# Patient Record
Sex: Female | Born: 1975
Health system: Southern US, Community
[De-identification: ages and names within clinical notes are randomized; demographics above are authoritative.]

## PROBLEM LIST (undated history)

## (undated) DIAGNOSIS — O26619 Liver and biliary tract disorders in pregnancy, unspecified trimester: Secondary | ICD-10-CM

## (undated) DIAGNOSIS — K831 Obstruction of bile duct: Secondary | ICD-10-CM

## (undated) DIAGNOSIS — E119 Type 2 diabetes mellitus without complications: Secondary | ICD-10-CM

## (undated) DIAGNOSIS — O26649 Intrahepatic cholestasis of pregnancy, unspecified trimester: Secondary | ICD-10-CM

## (undated) DIAGNOSIS — O139 Gestational [pregnancy-induced] hypertension without significant proteinuria, unspecified trimester: Secondary | ICD-10-CM

## (undated) HISTORY — DX: Type 2 diabetes mellitus without complications: E11.9

## (undated) HISTORY — PX: NO PAST SURGERIES: SHX2092

---

## 2012-04-26 DIAGNOSIS — O139 Gestational [pregnancy-induced] hypertension without significant proteinuria, unspecified trimester: Secondary | ICD-10-CM

## 2015-01-13 NOTE — L&D Delivery Note (Signed)
Delivery Note At 12:22 AM a viable female was delivered via Vaginal, Spontaneous Delivery. By the time I arrived, baby had delivered.  APGAR: 6, 8; weight pending.  Placenta status: intact, spontaneous.  Cord: 3 vessel  with the following complications: none.  Anesthesia:  epidural Episiotomy:  none Lacerations:  none Suture Repair: none Est. Blood Loss (mL):  63mL  Mom to postpartum.  Baby to Couplet care / Skin to Skin.  [name] 08/08/2015, 12:37 AM  Patient is a [id number] at [redacted]w[redacted]d who was admitted for IOL due to pre-x, cholestasis, and A1DM for which her preg has been significant for. She progressed with augmentation via Pitocin then AROM.  I was gloved and present for delivery in its entirety.  Second stage of labor progressed, baby delivered with RN present after one contraction.  No decels during second stage noted.  Complications: none  Lacerations: none  EBL: 50cc  Will continue Mag sulfate x 24 hrs PP.  Cam Hai, CNM 7:52 AM  08/08/2015

## 2015-03-21 LAB — OB RESULTS CONSOLE ABO/RH: RH TYPE: POSITIVE

## 2015-03-21 LAB — OB RESULTS CONSOLE HGB/HCT, BLOOD
HEMATOCRIT: 34 %
Hemoglobin: 10.7 g/dL

## 2015-03-21 LAB — OB RESULTS CONSOLE RPR: RPR: NONREACTIVE

## 2015-03-21 LAB — SICKLE CELL SCREEN

## 2015-03-21 LAB — OB RESULTS CONSOLE ANTIBODY SCREEN: Antibody Screen: NEGATIVE

## 2015-03-21 LAB — OB RESULTS CONSOLE HEPATITIS B SURFACE ANTIGEN: Hepatitis B Surface Ag: NEGATIVE

## 2015-04-02 LAB — GLUCOSE TOLERANCE, 1 HOUR: Glucose 1 Hour: 156

## 2015-04-02 LAB — OB RESULTS CONSOLE GC/CHLAMYDIA
CHLAMYDIA, DNA PROBE: NEGATIVE
GC PROBE AMP, GENITAL: NEGATIVE

## 2015-05-06 LAB — LAB REPORT - SCANNED

## 2015-07-22 ENCOUNTER — Encounter (HOSPITAL_COMMUNITY): Payer: Self-pay | Admitting: *Deleted

## 2015-07-22 ENCOUNTER — Encounter: Payer: Self-pay | Admitting: Obstetrics and Gynecology

## 2015-07-22 ENCOUNTER — Ambulatory Visit (INDEPENDENT_AMBULATORY_CARE_PROVIDER_SITE_OTHER): Payer: Self-pay | Admitting: Obstetrics and Gynecology

## 2015-07-22 ENCOUNTER — Inpatient Hospital Stay (HOSPITAL_COMMUNITY)
Admission: AD | Admit: 2015-07-22 | Discharge: 2015-07-22 | Disposition: A | Discharge status: Home / Self Care | Payer: Self-pay | Source: Ambulatory Visit | Attending: Obstetrics & Gynecology | Admitting: Obstetrics & Gynecology

## 2015-07-22 VITALS — BP 160/83 | HR 51 | Wt 239.0 lb

## 2015-07-22 DIAGNOSIS — O133 Gestational [pregnancy-induced] hypertension without significant proteinuria, third trimester: Secondary | ICD-10-CM

## 2015-07-22 DIAGNOSIS — Z833 Family history of diabetes mellitus: Secondary | ICD-10-CM | POA: Insufficient documentation

## 2015-07-22 DIAGNOSIS — Z3A34 34 weeks gestation of pregnancy: Secondary | ICD-10-CM | POA: Insufficient documentation

## 2015-07-22 DIAGNOSIS — Z880 Allergy status to penicillin: Secondary | ICD-10-CM | POA: Insufficient documentation

## 2015-07-22 DIAGNOSIS — R7302 Impaired glucose tolerance (oral): Secondary | ICD-10-CM

## 2015-07-22 DIAGNOSIS — O169 Unspecified maternal hypertension, unspecified trimester: Secondary | ICD-10-CM | POA: Insufficient documentation

## 2015-07-22 DIAGNOSIS — O0990 Supervision of high risk pregnancy, unspecified, unspecified trimester: Secondary | ICD-10-CM | POA: Insufficient documentation

## 2015-07-22 DIAGNOSIS — O1400 Mild to moderate pre-eclampsia, unspecified trimester: Secondary | ICD-10-CM

## 2015-07-22 DIAGNOSIS — R7309 Other abnormal glucose: Secondary | ICD-10-CM | POA: Insufficient documentation

## 2015-07-22 DIAGNOSIS — Z23 Encounter for immunization: Secondary | ICD-10-CM

## 2015-07-22 HISTORY — DX: Gestational (pregnancy-induced) hypertension without significant proteinuria, unspecified trimester: O13.9

## 2015-07-22 LAB — COMPREHENSIVE METABOLIC PANEL
ALK PHOS: 131 U/L — AB (ref 38–126)
ALT: 11 U/L — AB (ref 14–54)
AST: 15 U/L (ref 15–41)
Albumin: 2.7 g/dL — ABNORMAL LOW (ref 3.5–5.0)
Anion gap: 10 (ref 5–15)
BILIRUBIN TOTAL: 0.4 mg/dL (ref 0.3–1.2)
BUN: 6 mg/dL (ref 6–20)
CALCIUM: 9.5 mg/dL (ref 8.9–10.3)
CHLORIDE: 107 mmol/L (ref 101–111)
CO2: 18 mmol/L — ABNORMAL LOW (ref 22–32)
CREATININE: 0.41 mg/dL — AB (ref 0.44–1.00)
Glucose, Bld: 93 mg/dL (ref 65–99)
Potassium: 3.7 mmol/L (ref 3.5–5.1)
Sodium: 135 mmol/L (ref 135–145)
TOTAL PROTEIN: 6.4 g/dL — AB (ref 6.5–8.1)

## 2015-07-22 LAB — URINE MICROSCOPIC-ADD ON

## 2015-07-22 LAB — CBC
HEMATOCRIT: 30.6 % — AB (ref 36.0–46.0)
Hemoglobin: 10 g/dL — ABNORMAL LOW (ref 12.0–15.0)
MCH: 24.6 pg — AB (ref 26.0–34.0)
MCHC: 32.7 g/dL (ref 30.0–36.0)
MCV: 75.2 fL — AB (ref 78.0–100.0)
PLATELETS: 207 10*3/uL (ref 150–400)
RBC: 4.07 MIL/uL (ref 3.87–5.11)
RDW: 17.1 % — ABNORMAL HIGH (ref 11.5–15.5)
WBC: 8.7 10*3/uL (ref 4.0–10.5)

## 2015-07-22 LAB — POCT URINALYSIS DIP (DEVICE)
BILIRUBIN URINE: NEGATIVE
GLUCOSE, UA: NEGATIVE mg/dL
Hgb urine dipstick: NEGATIVE
KETONES UR: NEGATIVE mg/dL
Leukocytes, UA: NEGATIVE
Nitrite: NEGATIVE
PH: 7 (ref 5.0–8.0)
PROTEIN: NEGATIVE mg/dL
SPECIFIC GRAVITY, URINE: 1.01 (ref 1.005–1.030)
Urobilinogen, UA: 0.2 mg/dL (ref 0.0–1.0)

## 2015-07-22 LAB — PROTEIN / CREATININE RATIO, URINE
CREATININE, URINE: 23 mg/dL
Protein Creatinine Ratio: 0.74 mg/mg{Cre} — ABNORMAL HIGH (ref 0.00–0.15)
TOTAL PROTEIN, URINE: 17 mg/dL

## 2015-07-22 LAB — URINALYSIS, ROUTINE W REFLEX MICROSCOPIC
BILIRUBIN URINE: NEGATIVE
Glucose, UA: NEGATIVE mg/dL
KETONES UR: NEGATIVE mg/dL
NITRITE: NEGATIVE
PROTEIN: NEGATIVE mg/dL
SPECIFIC GRAVITY, URINE: 1.01 (ref 1.005–1.030)
pH: 7 (ref 5.0–8.0)

## 2015-07-22 NOTE — Progress Notes (Signed)
Subjective:  Rebecca Ruiz is a 40 y.o. [id number] at 6713w6d being seen today for initial prenatal care.  She is currently monitored for the following issues for this high-risk pregnancy and has Supervision of high-risk pregnancy; Elevated blood pressure affecting pregnancy, antepartum; Elevated glucose tolerance test; and Gestational hypertension w/o significant proteinuria in 3rd trimester on her problem list.  Patient reports leg swelling.  Contractions: Irritability. Vag. Bleeding: None.  Movement: Present. Denies leaking of fluid.   The following portions of the patient's history were reviewed and updated as appropriate: allergies, current medications, past family history, past medical history, past social history, past surgical history and problem list. Problem list updated.  Objective:   Filed Vitals:   07/22/15 0851 07/22/15 0854  BP: 148/81 160/83  Pulse: 51   Weight: 239 lb (108.41 kg)     Fetal Status: Fetal Heart Rate (bpm): 159 Fundal Height: 34 cm Movement: Present     General:  Alert, oriented and cooperative. Patient is in no acute distress.  Skin: Skin is warm and dry. No rash noted.   Cardiovascular: Normal heart rate noted  Respiratory: Normal respiratory effort, no problems with respiration noted  Abdomen: Soft, gravid, appropriate for gestational age. Pain/Pressure: Present     Pelvic:  Cervical exam deferred        Extremities: Normal range of motion.  Edema: Moderate pitting, indentation subsides rapidly  Mental Status: Normal mood and affect. Normal behavior. Normal judgment and thought content.   Urinalysis:      Assessment and Plan:  Pregnancy: [id number] at 5913w6d  # Elevated bp - at risk given hx ghtn. Asymptomatic, but second bp severe range - to mau for preE/hellp/ghtn eval - assuming discharge and dx made, will need u/s for growth and starting antenatal testing. mau provider made aware  # pregnancy - tdap today. Antenatal records reviewed, f/u  records requested  There are no diagnoses linked to this encounter. Preterm labor symptoms and general obstetric precautions including but not limited to vaginal bleeding, contractions, leaking of fluid and fetal movement were reviewed in detail with the patient. Please refer to After Visit Summary for other counseling recommendations.   Kathrynn RunningNoah Bedford Rebecca Swopes, MD

## 2015-07-22 NOTE — Discharge Instructions (Signed)
Hipertensin durante el embarazo (Hypertension During Pregnancy) La hipertensin tambin se denomina presin arterial alta. La presin arterial hace que se mueva la sangre en el cuerpo. A veces, la fuerza que Northrop Grummanmueve la sangre es demasiado intensa. Cuando est embarazada, esta afeccin se debe controlar atentamente. Puede causar problemas para usted y su beb. CUIDADOS EN EL HOGAR   Cumpla con todos los controles mdicos.  Tome los United Parcelmedicamentos como le indic su mdico. Informe a su mdico sobre todos los medicamentos que toma.  Coma muy poca sal.  Haga ejercicios regularmente.  No beba alcohol.  No fume.  No tome bebidas con cafena.  Acustese sobre el lado izquierdo cuando haga reposo.  Su mdico puede recomendarle que tome una aspirina de dosis baja (81 mg) cada da. SOLICITE AYUDA DE INMEDIATO SI:  Siente un dolor intenso en el vientre (abdominal).  Nota una hinchazn repentina Jabil Circuiten las manos, los tobillos o la cara.  Aument 4libras (1,8kg) o ms en 1semana.  Vomita) repetidas veces.  Tiene una hemorragia por la vagina.  No siente los movimientos del beb.  Tiene cefalea.  Tiene visin doble o borrosa.  Tiene calambres o espasmos musculares.  Le falta el aire.  Tiene las yemas de los dedos y los labios Carrier Millsazules.  Observa sangre en la orina. ASEGRESE DE QUE:  Comprende estas instrucciones.  Controlar su afeccin.  Recibir ayuda de inmediato si no mejora o si empeora.   Esta informacin no tiene Theme park managercomo fin reemplazar el consejo del mdico. Asegrese de hacerle al mdico cualquier pregunta que tenga.   Document Released: 04/15/2010 Document Revised: 01/19/2014 Elsevier Interactive Patient Education [employer]2016 Elsevier Inc.

## 2015-07-22 NOTE — MAU Note (Signed)
Sen up from clinic for further eval of BP.  Hx of elevation with last preg.

## 2015-07-22 NOTE — MAU Provider Note (Signed)
MAU HISTORY AND PHYSICAL  Chief Complaint:  Elevated BP in clinic  Rebecca Ruiz is a 40 y.o.  [id number] with IUP at 4025w6d [facility]. She denies h/a, epigastric pain, or visual disturbances.  She has hx of GHTN with previous pregnancy. She reports good fetal movement, denies LOF, vaginal bleeding, vaginal itching/burning, urinary symptoms, h/a, dizziness, n/v, or fever/chills.     Past Medical History  Diagnosis Date  . Pregnancy induced hypertension     Past Surgical History  Procedure Laterality Date  . No past surgeries      Family History  Problem Relation Age of Onset  . Diabetes Mother   . Cancer Mother     colon    Social History  Substance Use Topics  . Smoking status: Never Smoker   . Smokeless tobacco: Never Used  . Alcohol Use: No    Allergies  Allergen Reactions  . Penicillins Rash    Has patient had a PCN reaction causing immediate rash, facial/tongue/throat swelling, SOB or lightheadedness with hypotension: Yes Has patient had a PCN reaction causing severe rash involving mucus membranes or skin necrosis: Yes Has patient had a PCN reaction that required hospitalization No Has patient had a PCN reaction occurring within the last 10 years: No If all of the above answers are "NO", then may proceed with Cephalosporin use.    No prescriptions prior to admission    Review of Systems - Negative except for what is mentioned in HPI.  Physical Exam  Blood pressure 142/89, pulse 57, temperature 98.2 F (36.8 C), temperature source Oral, resp. rate 16, last menstrual period 11/20/2014. GENERAL: Well-developed, well-nourished female in no acute distress.  LUNGS: Clear to auscultation bilaterally.  HEART: Regular rate and rhythm. ABDOMEN: Soft, nontender, nondistended, gravid.  EXTREMITIES: Nontender, 1+ nonpitting edema BLE, 2+ distal pulses. FHT:  Cat 1 Contractions: infrequent   Labs: Results for orders placed or  performed during the hospital encounter of 07/22/15 (from the past 24 hour(s))  Urinalysis, Routine w reflex microscopic ([facility])   Collection Time: 07/22/15  9:51 AM  Result Value Ref Range   Color, Urine YELLOW YELLOW   APPearance CLEAR CLEAR   Specific Gravity, Urine 1.010 1.005 - 1.030   pH 7.0 5.0 - 8.0   Glucose, UA NEGATIVE NEGATIVE mg/dL   Hgb urine dipstick TRACE (A) NEGATIVE   Bilirubin Urine NEGATIVE NEGATIVE   Ketones, ur NEGATIVE NEGATIVE mg/dL   Protein, ur NEGATIVE NEGATIVE mg/dL   Nitrite NEGATIVE NEGATIVE   Leukocytes, UA LARGE (A) NEGATIVE  Urine microscopic-add on   Collection Time: 07/22/15  9:51 AM  Result Value Ref Range   Squamous Epithelial / LPF 0-5 (A) NONE SEEN   WBC, UA 6-30 0 - 5 WBC/hpf   RBC / HPF 0-5 0 - 5 RBC/hpf   Bacteria, UA FEW (A) NONE SEEN  Protein / creatinine ratio, urine   Collection Time: 07/22/15 10:05 AM  Result Value Ref Range   Creatinine, Urine 23.00 mg/dL   Total Protein, Urine 17 mg/dL   Protein Creatinine Ratio 0.74 (H) 0.00 - 0.15 mg/mg[Cre]  CBC   Collection Time: 07/22/15 10:51 AM  Result Value Ref Range   WBC 8.7 4.0 - 10.5 K/uL   RBC 4.07 3.87 - 5.11 MIL/uL   Hemoglobin 10.0 (L) 12.0 - 15.0 g/dL   HCT 19.130.6 (L) 47.836.0 - 29.546.0 %   MCV 75.2 (L) 78.0 - 100.0 fL   MCH  24.6 (L) 26.0 - 34.0 pg   MCHC 32.7 30.0 - 36.0 g/dL   RDW 16.1 (H) 09.6 - 04.5 %   Platelets 207 150 - 400 K/uL  Comprehensive metabolic panel   Collection Time: 07/22/15 10:51 AM  Result Value Ref Range   Sodium 135 135 - 145 mmol/L   Potassium 3.7 3.5 - 5.1 mmol/L   Chloride 107 101 - 111 mmol/L   CO2 18 (L) 22 - 32 mmol/L   Glucose, Bld 93 65 - 99 mg/dL   BUN 6 6 - 20 mg/dL   Creatinine, Ser 4.09 (L) 0.44 - 1.00 mg/dL   Calcium 9.5 8.9 - 81.1 mg/dL   Total Protein 6.4 (L) 6.5 - 8.1 g/dL   Albumin 2.7 (L) 3.5 - 5.0 g/dL   AST 15 15 - 41 U/L   ALT 11 (L) 14 - 54 U/L   Alkaline Phosphatase 131 (H) 38 - 126 U/L   Total Bilirubin 0.4 0.3 - 1.2  mg/dL   GFR calc non Af Amer >60 >60 mL/min   GFR calc Af Amer >60 >60 mL/min   Anion gap 10 5 - 15  Results for orders placed or performed in visit on 07/22/15 (from the past 24 hour(s))  POCT urinalysis dip (device)   Collection Time: 07/22/15  9:21 AM  Result Value Ref Range   Glucose, UA NEGATIVE NEGATIVE mg/dL   Bilirubin Urine NEGATIVE NEGATIVE   Ketones, ur NEGATIVE NEGATIVE mg/dL   Specific Gravity, Urine 1.010 1.005 - 1.030   Hgb urine dipstick NEGATIVE NEGATIVE   pH 7.0 5.0 - 8.0   Protein, ur NEGATIVE NEGATIVE mg/dL   Urobilinogen, UA 0.2 0.0 - 1.0 mg/dL   Nitrite NEGATIVE NEGATIVE   Leukocytes, UA NEGATIVE NEGATIVE   MDM: Ordered labs and reviewed results.  Preeclampsia labs wnl with P/C ratio pending at time of discharge.  Pt without s/sx of preeclampsia, no severe features.  D/C home with preeclampsia precautions.  Message sent to clinic to start antenatal testing with BPP or NST/AFI and BP check later this week. Delivery at 37 weeks unless preeclampsia with severe features develops. Pt stable at time of discharge.   Assessment: Rebecca Ruiz is  40 y.o. [id number] at [redacted]w[redacted]d  1. Gestational hypertension w/o significant proteinuria in 3rd trimester     Plan: D/C home with preeclampsia precautions Start antenatal testing for [facility] [place] for growth ordered  LEFTWICH-KIRBY, Merril Isakson 7/10/20179:45 PM   Addendum:  P/C ratio 0.74 so diagnosis is preeclampsia without severe features.  Continue plan for antenatal testing/delivery at 37 weeks unless severe features develop.

## 2015-07-22 NOTE — Progress Notes (Signed)
Mariel for interpreter for check in  Patient denies headaches, dizziness, or blurry vision

## 2015-07-23 LAB — HIV ANTIBODY (ROUTINE TESTING W REFLEX): HIV SCREEN 4TH GENERATION: NONREACTIVE

## 2015-07-23 LAB — RPR: RPR Ser Ql: NONREACTIVE

## 2015-07-29 ENCOUNTER — Ambulatory Visit (INDEPENDENT_AMBULATORY_CARE_PROVIDER_SITE_OTHER): Payer: Self-pay | Admitting: Obstetrics and Gynecology

## 2015-07-29 VITALS — BP 136/76 | HR 71 | Wt 238.7 lb

## 2015-07-29 DIAGNOSIS — O24419 Gestational diabetes mellitus in pregnancy, unspecified control: Secondary | ICD-10-CM

## 2015-07-29 DIAGNOSIS — Z113 Encounter for screening for infections with a predominantly sexual mode of transmission: Secondary | ICD-10-CM

## 2015-07-29 DIAGNOSIS — O1403 Mild to moderate pre-eclampsia, third trimester: Secondary | ICD-10-CM

## 2015-07-29 DIAGNOSIS — O0993 Supervision of high risk pregnancy, unspecified, third trimester: Secondary | ICD-10-CM

## 2015-07-29 LAB — POCT URINALYSIS DIP (DEVICE)
GLUCOSE, UA: NEGATIVE mg/dL
Ketones, ur: NEGATIVE mg/dL
Nitrite: NEGATIVE
Protein, ur: 100 mg/dL — AB
Specific Gravity, Urine: 1.02 (ref 1.005–1.030)
UROBILINOGEN UA: 1 mg/dL (ref 0.0–1.0)
pH: 7 (ref 5.0–8.0)

## 2015-07-29 LAB — OB RESULTS CONSOLE GBS: STREP GROUP B AG: NEGATIVE

## 2015-07-29 LAB — CBC
HCT: 30.2 % — ABNORMAL LOW (ref 35.0–45.0)
HEMOGLOBIN: 9.8 g/dL — AB (ref 11.7–15.5)
MCH: 24.3 pg — AB (ref 27.0–33.0)
MCHC: 32.5 g/dL (ref 32.0–36.0)
MCV: 74.8 fL — ABNORMAL LOW (ref 80.0–100.0)
MPV: 9.2 fL (ref 7.5–12.5)
PLATELETS: 200 10*3/uL (ref 140–400)
RBC: 4.04 MIL/uL (ref 3.80–5.10)
RDW: 17.5 % — ABNORMAL HIGH (ref 11.0–15.0)
WBC: 8.7 10*3/uL (ref 3.8–10.8)

## 2015-07-29 NOTE — Progress Notes (Signed)
US and BPP scheduled 7/20 @ 1045

## 2015-07-29 NOTE — Progress Notes (Signed)
Video Interpreter [phone] 36 wk cultures today

## 2015-07-29 NOTE — Progress Notes (Signed)
Subjective:  Rebecca Ruiz is a 40 y.o. [phone] at 2234w6d being seen today for ongoing prenatal care.  She is currently monitored for the following issues for this high-risk pregnancy and has Supervision of high-risk pregnancy; Mild preeclampsia; and Gestational diabetes mellitus on her problem list.  Patient reports no complaints.  Contractions: Irregular. Vag. Bleeding: None.  Movement: Present. Denies leaking of fluid.   The following portions of the patient's history were reviewed and updated as appropriate: allergies, current medications, past family history, past medical history, past social history, past surgical history and problem list. Problem list updated.  Objective:   Filed Vitals:   07/29/15 1527  BP: 136/76  Pulse: 71  Weight: 238 lb 11.2 oz (108.274 kg)    Fetal Status: Fetal Heart Rate (bpm): 161   Movement: Present     General:  Alert, oriented and cooperative. Patient is in no acute distress.  Skin: Skin is warm and dry. No rash noted.   Cardiovascular: Normal heart rate noted  Respiratory: Normal respiratory effort, no problems with respiration noted  Abdomen: Soft, gravid, appropriate for gestational age. Pain/Pressure: Present     Pelvic:  Cervical exam deferred        Extremities: Normal range of motion.  Edema: Mild pitting, slight indentation  Mental Status: Normal mood and affect. Normal behavior. Normal judgment and thought content.   Urinalysis:      Assessment and Plan:  Pregnancy: [id number] at 5934w6d  # mild preE - asymptomatic - cbc, cmp today. bp wnl - start 2x weekly nst - iol @ 37 wks, scheduling today - growth u/s ordered  # GDM - results of 3 hour arrived today, 2/4 results abnormal - 2x weekly testing - growth u/s - given iol in one week, do not think much benefit of starting glucose testing now - a1c, and random sugar with cmp  # pruritus - no rash - bile acids  term labor symptoms and general obstetric precautions including  but not limited to vaginal bleeding, contractions, leaking of fluid and fetal movement were reviewed in detail with the patient. Please refer to After Visit Summary for other counseling recommendations.  F/u for nst later this week with afi  Kathrynn RunningNoah Bedford Arias Weinert, MD

## 2015-07-30 LAB — COMPREHENSIVE METABOLIC PANEL
ALBUMIN: 2.7 g/dL — AB (ref 3.6–5.1)
ALK PHOS: 154 U/L — AB (ref 33–115)
ALT: 8 U/L (ref 6–29)
AST: 14 U/L (ref 10–30)
BILIRUBIN TOTAL: 0.3 mg/dL (ref 0.2–1.2)
BUN: 7 mg/dL (ref 7–25)
CALCIUM: 8.6 mg/dL (ref 8.6–10.2)
CO2: 22 mmol/L (ref 20–31)
Chloride: 104 mmol/L (ref 98–110)
Creat: 0.59 mg/dL (ref 0.50–1.10)
Glucose, Bld: 102 mg/dL — ABNORMAL HIGH (ref 65–99)
POTASSIUM: 3.8 mmol/L (ref 3.5–5.3)
Sodium: 138 mmol/L (ref 135–146)
TOTAL PROTEIN: 5.8 g/dL — AB (ref 6.1–8.1)

## 2015-07-30 LAB — GC/CHLAMYDIA PROBE AMP (~~LOC~~) NOT AT ARMC
Chlamydia: NEGATIVE
NEISSERIA GONORRHEA: NEGATIVE

## 2015-07-30 LAB — HEMOGLOBIN A1C
HEMOGLOBIN A1C: 5.6 % (ref <5.7)
MEAN PLASMA GLUCOSE: 114 mg/dL

## 2015-07-31 ENCOUNTER — Telehealth (HOSPITAL_COMMUNITY): Payer: Self-pay | Admitting: *Deleted

## 2015-07-31 ENCOUNTER — Encounter (HOSPITAL_COMMUNITY): Payer: Self-pay | Admitting: *Deleted

## 2015-07-31 LAB — CULTURE, STREPTOCOCCUS GRP B W/SUSCEPT

## 2015-07-31 NOTE — Telephone Encounter (Signed)
Interpreter number [phone]

## 2015-08-01 ENCOUNTER — Encounter: Payer: Self-pay | Admitting: Obstetrics and Gynecology

## 2015-08-01 ENCOUNTER — Other Ambulatory Visit: Payer: Self-pay | Admitting: Obstetrics & Gynecology

## 2015-08-01 ENCOUNTER — Encounter (HOSPITAL_COMMUNITY): Payer: Self-pay

## 2015-08-01 ENCOUNTER — Ambulatory Visit (HOSPITAL_COMMUNITY)
Admission: RE | Admit: 2015-08-01 | Discharge: 2015-08-01 | Disposition: A | Discharge status: Home / Self Care | Payer: Self-pay | Source: Ambulatory Visit | Attending: Obstetrics and Gynecology | Admitting: Obstetrics and Gynecology

## 2015-08-01 ENCOUNTER — Telehealth: Payer: Self-pay | Admitting: Obstetrics and Gynecology

## 2015-08-01 ENCOUNTER — Telehealth: Payer: Self-pay

## 2015-08-01 ENCOUNTER — Other Ambulatory Visit: Payer: Self-pay | Admitting: Obstetrics and Gynecology

## 2015-08-01 DIAGNOSIS — O24419 Gestational diabetes mellitus in pregnancy, unspecified control: Secondary | ICD-10-CM

## 2015-08-01 DIAGNOSIS — Z8759 Personal history of other complications of pregnancy, childbirth and the puerperium: Secondary | ICD-10-CM

## 2015-08-01 DIAGNOSIS — O1403 Mild to moderate pre-eclampsia, third trimester: Secondary | ICD-10-CM

## 2015-08-01 DIAGNOSIS — O09213 Supervision of pregnancy with history of pre-term labor, third trimester: Secondary | ICD-10-CM | POA: Insufficient documentation

## 2015-08-01 DIAGNOSIS — O99019 Anemia complicating pregnancy, unspecified trimester: Secondary | ICD-10-CM | POA: Insufficient documentation

## 2015-08-01 DIAGNOSIS — Z3A36 36 weeks gestation of pregnancy: Secondary | ICD-10-CM | POA: Insufficient documentation

## 2015-08-01 DIAGNOSIS — O26619 Liver and biliary tract disorders in pregnancy, unspecified trimester: Secondary | ICD-10-CM

## 2015-08-01 DIAGNOSIS — O1493 Unspecified pre-eclampsia, third trimester: Secondary | ICD-10-CM | POA: Insufficient documentation

## 2015-08-01 DIAGNOSIS — O09523 Supervision of elderly multigravida, third trimester: Secondary | ICD-10-CM | POA: Insufficient documentation

## 2015-08-01 DIAGNOSIS — O26613 Liver and biliary tract disorders in pregnancy, third trimester: Principal | ICD-10-CM

## 2015-08-01 DIAGNOSIS — Z3689 Encounter for other specified antenatal screening: Secondary | ICD-10-CM

## 2015-08-01 DIAGNOSIS — Z36 Encounter for antenatal screening of mother: Secondary | ICD-10-CM | POA: Insufficient documentation

## 2015-08-01 DIAGNOSIS — O09293 Supervision of pregnancy with other poor reproductive or obstetric history, third trimester: Secondary | ICD-10-CM | POA: Insufficient documentation

## 2015-08-01 DIAGNOSIS — K831 Obstruction of bile duct: Secondary | ICD-10-CM | POA: Insufficient documentation

## 2015-08-01 HISTORY — DX: Obstruction of bile duct: K83.1

## 2015-08-01 HISTORY — DX: Intrahepatic cholestasis of pregnancy, unspecified trimester: O26.649

## 2015-08-01 HISTORY — DX: Liver and biliary tract disorders in pregnancy, unspecified trimester: O26.619

## 2015-08-01 LAB — BILE ACIDS, TOTAL: Bile Acids Total: 16 umol/L (ref 0–19)

## 2015-08-01 MED ORDER — URSODIOL 300 MG PO CAPS: 300.0000 mg | ORAL_CAPSULE | Freq: Three times a day (TID) | ORAL | Status: DC

## 2015-08-01 MED FILL — *URSODIOL 300 MG CAPSULE: 300 | 5 days supply | Qty: 16 | Fill #0

## 2015-08-01 NOTE — Telephone Encounter (Signed)
Bile acids 16 with pruritus, no rash. New dx cholestasis. Already in 2x weekly testing with iol scheduled for 37 wks. Will have nursing call to inform, and will start ursodiol.

## 2015-08-01 NOTE — Telephone Encounter (Signed)
This patient has cholestasis of pregnancy. I have sent a script for ursodiol to the [facility] as Diane says it is much cheaper there than at other pharmacies. Please help her schedule an NST for Monday or Tuesday. She has induction scheduled this coming Wednesday.    Patient has been notified of medication and she has been schedule for appointment for next week.

## 2015-08-06 ENCOUNTER — Ambulatory Visit (INDEPENDENT_AMBULATORY_CARE_PROVIDER_SITE_OTHER): Payer: Self-pay | Admitting: *Deleted

## 2015-08-06 VITALS — BP 156/82

## 2015-08-06 DIAGNOSIS — O26613 Liver and biliary tract disorders in pregnancy, third trimester: Secondary | ICD-10-CM

## 2015-08-06 DIAGNOSIS — K831 Obstruction of bile duct: Secondary | ICD-10-CM

## 2015-08-06 NOTE — Progress Notes (Addendum)
IOL scheduled tomorrow @ 0700 NST reactive on 08/06/15 with baseline 120.  No decelerations.

## 2015-08-07 ENCOUNTER — Inpatient Hospital Stay (HOSPITAL_COMMUNITY): Payer: Medicaid Other | Admitting: Anesthesiology

## 2015-08-07 ENCOUNTER — Inpatient Hospital Stay (HOSPITAL_COMMUNITY)
Admission: RE | Admit: 2015-08-07 | Discharge: 2015-08-09 | DRG: 775 | Disposition: A | Discharge status: Home / Self Care | Payer: Medicaid Other | Source: Ambulatory Visit | Attending: Obstetrics and Gynecology | Admitting: Obstetrics and Gynecology

## 2015-08-07 ENCOUNTER — Encounter (HOSPITAL_COMMUNITY): Payer: Self-pay

## 2015-08-07 DIAGNOSIS — D649 Anemia, unspecified: Secondary | ICD-10-CM | POA: Diagnosis present

## 2015-08-07 DIAGNOSIS — K831 Obstruction of bile duct: Secondary | ICD-10-CM | POA: Diagnosis present

## 2015-08-07 DIAGNOSIS — O99214 Obesity complicating childbirth: Secondary | ICD-10-CM | POA: Diagnosis present

## 2015-08-07 DIAGNOSIS — O2662 Liver and biliary tract disorders in childbirth: Secondary | ICD-10-CM | POA: Diagnosis present

## 2015-08-07 DIAGNOSIS — O9902 Anemia complicating childbirth: Secondary | ICD-10-CM | POA: Diagnosis present

## 2015-08-07 DIAGNOSIS — Z3A37 37 weeks gestation of pregnancy: Secondary | ICD-10-CM

## 2015-08-07 DIAGNOSIS — O1404 Mild to moderate pre-eclampsia, complicating childbirth: Secondary | ICD-10-CM | POA: Diagnosis present

## 2015-08-07 DIAGNOSIS — Z833 Family history of diabetes mellitus: Secondary | ICD-10-CM

## 2015-08-07 DIAGNOSIS — O24429 Gestational diabetes mellitus in childbirth, unspecified control: Secondary | ICD-10-CM | POA: Diagnosis present

## 2015-08-07 DIAGNOSIS — Z6841 Body Mass Index (BMI) 40.0 and over, adult: Secondary | ICD-10-CM | POA: Diagnosis not present

## 2015-08-07 DIAGNOSIS — O1400 Mild to moderate pre-eclampsia, unspecified trimester: Secondary | ICD-10-CM | POA: Diagnosis present

## 2015-08-07 LAB — CBC
HCT: 30 % — ABNORMAL LOW (ref 36.0–46.0)
HEMATOCRIT: 32 % — AB (ref 36.0–46.0)
HEMOGLOBIN: 10.3 g/dL — AB (ref 12.0–15.0)
Hemoglobin: 9.6 g/dL — ABNORMAL LOW (ref 12.0–15.0)
MCH: 23.8 pg — AB (ref 26.0–34.0)
MCH: 24 pg — AB (ref 26.0–34.0)
MCHC: 32 g/dL (ref 30.0–36.0)
MCHC: 32.2 g/dL (ref 30.0–36.0)
MCV: 74.4 fL — AB (ref 78.0–100.0)
MCV: 74.6 fL — ABNORMAL LOW (ref 78.0–100.0)
PLATELETS: 211 10*3/uL (ref 150–400)
Platelets: 214 10*3/uL (ref 150–400)
RBC: 4.03 MIL/uL (ref 3.87–5.11)
RBC: 4.29 MIL/uL (ref 3.87–5.11)
RDW: 17.3 % — AB (ref 11.5–15.5)
RDW: 17.5 % — ABNORMAL HIGH (ref 11.5–15.5)
WBC: 9.5 10*3/uL (ref 4.0–10.5)
WBC: 13.2 10*3/uL — ABNORMAL HIGH (ref 4.0–10.5)

## 2015-08-07 LAB — RPR: RPR: NONREACTIVE

## 2015-08-07 LAB — COMPREHENSIVE METABOLIC PANEL
ALK PHOS: 140 U/L — AB (ref 38–126)
ALT: 16 U/L (ref 14–54)
AST: 20 U/L (ref 15–41)
Albumin: 2.4 g/dL — ABNORMAL LOW (ref 3.5–5.0)
Anion gap: 7 (ref 5–15)
BUN: 7 mg/dL (ref 6–20)
CALCIUM: 8.1 mg/dL — AB (ref 8.9–10.3)
CHLORIDE: 106 mmol/L (ref 101–111)
CO2: 20 mmol/L — AB (ref 22–32)
CREATININE: 0.44 mg/dL (ref 0.44–1.00)
GFR calc Af Amer: >60 mL/min (ref >60)
Glucose, Bld: 88 mg/dL (ref 65–99)
Potassium: 3.4 mmol/L — ABNORMAL LOW (ref 3.5–5.1)
Sodium: 133 mmol/L — ABNORMAL LOW (ref 135–145)
Total Bilirubin: 0.4 mg/dL (ref 0.3–1.2)
Total Protein: 5.6 g/dL — ABNORMAL LOW (ref 6.5–8.1)

## 2015-08-07 LAB — TYPE AND SCREEN
ABO/RH(D): AB POS
Antibody Screen: NEGATIVE

## 2015-08-07 LAB — GLUCOSE, CAPILLARY
Glucose-Capillary: 104 mg/dL — ABNORMAL HIGH (ref 65–99)
Glucose-Capillary: 91 mg/dL (ref 65–99)
Glucose-Capillary: 87 mg/dL (ref 65–99)
Glucose-Capillary: 117 mg/dL — ABNORMAL HIGH (ref 65–99)

## 2015-08-07 LAB — ABO/RH: ABO/RH(D): AB POS

## 2015-08-07 MED ORDER — SOD CITRATE-CITRIC ACID 500-334 MG/5ML PO SOLN: 30.0000 mL | ORAL | Status: DC | PRN

## 2015-08-07 MED ORDER — TERBUTALINE SULFATE 1 MG/ML IJ SOLN: 0.2500 mg | Freq: Once | INTRAMUSCULAR | Status: DC | PRN

## 2015-08-07 MED ORDER — PHENYLEPHRINE 40 MCG/ML (10ML) SYRINGE FOR IV PUSH (FOR BLOOD PRESSURE SUPPORT)
80.0000 ug | PREFILLED_SYRINGE | INTRAVENOUS | Status: DC | PRN
  Filled 2015-08-07: qty 5
  Filled 2015-08-07: qty 10

## 2015-08-07 MED ORDER — EPHEDRINE 5 MG/ML INJ
10.0000 mg | INTRAVENOUS | Status: DC | PRN
  Filled 2015-08-07: qty 4

## 2015-08-07 MED ORDER — LABETALOL HCL 100 MG PO TABS
100.0000 mg | ORAL_TABLET | Freq: Two times a day (BID) | ORAL | Status: DC
  Filled 2015-08-07 (×3): qty 1

## 2015-08-07 MED ORDER — MAGNESIUM SULFATE 50 % IJ SOLN
2.0000 g/h | INTRAVENOUS | Status: AC
  Administered 2015-08-08: 2 g/h via INTRAVENOUS
  Filled 2015-08-07 (×2): qty 80

## 2015-08-07 MED ORDER — LABETALOL HCL 5 MG/ML IV SOLN
20.0000 mg | INTRAVENOUS | Status: AC | PRN
Start: 2015-08-07 — End: 2015-08-08
  Administered 2015-08-07: 40 mg via INTRAVENOUS
  Administered 2015-08-07: 20 mg via INTRAVENOUS
  Administered 2015-08-08: 80 mg via INTRAVENOUS
  Filled 2015-08-07: qty 16
  Filled 2015-08-07: qty 8
  Filled 2015-08-07: qty 4

## 2015-08-07 MED ORDER — LACTATED RINGERS IV SOLN: 500.0000 mL | INTRAVENOUS | Status: DC | PRN

## 2015-08-07 MED ORDER — OXYTOCIN 40 UNITS IN LACTATED RINGERS INFUSION - SIMPLE MED
2.5000 [IU]/h | INTRAVENOUS | Status: DC
  Filled 2015-08-07: qty 1000

## 2015-08-07 MED ORDER — OXYTOCIN BOLUS FROM INFUSION: 500.0000 mL | Freq: Once | INTRAVENOUS | Status: DC

## 2015-08-07 MED ORDER — PHENYLEPHRINE 40 MCG/ML (10ML) SYRINGE FOR IV PUSH (FOR BLOOD PRESSURE SUPPORT)
80.0000 ug | PREFILLED_SYRINGE | INTRAVENOUS | Status: DC | PRN
  Filled 2015-08-07: qty 5

## 2015-08-07 MED ORDER — MAGNESIUM SULFATE BOLUS VIA INFUSION
4.0000 g | Freq: Once | INTRAVENOUS | Status: AC
  Administered 2015-08-07: 4 g via INTRAVENOUS
  Filled 2015-08-07: qty 500

## 2015-08-07 MED ORDER — FENTANYL 2.5 MCG/ML BUPIVACAINE 1/10 % EPIDURAL INFUSION (WH - ANES)
14.0000 mL/h | INTRAMUSCULAR | Status: DC | PRN
  Administered 2015-08-07: 14 mL/h via EPIDURAL
  Filled 2015-08-07: qty 125

## 2015-08-07 MED ORDER — HYDRALAZINE HCL 20 MG/ML IJ SOLN
10.0000 mg | Freq: Once | INTRAMUSCULAR | Status: AC | PRN
  Administered 2015-08-07: 10 mg via INTRAVENOUS
  Filled 2015-08-07: qty 1

## 2015-08-07 MED ORDER — LABETALOL HCL 200 MG PO TABS
200.0000 mg | ORAL_TABLET | Freq: Two times a day (BID) | ORAL | Status: DC
  Administered 2015-08-08 – 2015-08-09 (×4): 200 mg via ORAL
  Filled 2015-08-07 (×5): qty 1

## 2015-08-07 MED ORDER — DIPHENHYDRAMINE HCL 50 MG/ML IJ SOLN: 12.5000 mg | INTRAMUSCULAR | Status: DC | PRN

## 2015-08-07 MED ORDER — ACETAMINOPHEN 325 MG PO TABS: 650.0000 mg | ORAL_TABLET | ORAL | Status: DC | PRN

## 2015-08-07 MED ORDER — OXYCODONE-ACETAMINOPHEN 5-325 MG PO TABS: 1.0000 | ORAL_TABLET | ORAL | Status: DC | PRN

## 2015-08-07 MED ORDER — FENTANYL CITRATE (PF) 100 MCG/2ML IJ SOLN
50.0000 ug | INTRAMUSCULAR | Status: DC | PRN
  Administered 2015-08-07 (×3): 100 ug via INTRAVENOUS
  Filled 2015-08-07 (×3): qty 2

## 2015-08-07 MED ORDER — ONDANSETRON HCL 4 MG/2ML IJ SOLN: 4.0000 mg | Freq: Four times a day (QID) | INTRAMUSCULAR | Status: DC | PRN

## 2015-08-07 MED ORDER — FLEET ENEMA 7-19 GM/118ML RE ENEM: 1.0000 | ENEMA | RECTAL | Status: DC | PRN

## 2015-08-07 MED ORDER — LACTATED RINGERS IV SOLN
500.0000 mL | Freq: Once | INTRAVENOUS | Status: AC
  Administered 2015-08-07: 250 mL via INTRAVENOUS

## 2015-08-07 MED ORDER — LIDOCAINE HCL (PF) 1 % IJ SOLN
30.0000 mL | INTRAMUSCULAR | Status: AC | PRN
  Administered 2015-08-07: 4 mL via SUBCUTANEOUS

## 2015-08-07 MED ORDER — OXYCODONE-ACETAMINOPHEN 5-325 MG PO TABS: 2.0000 | ORAL_TABLET | ORAL | Status: DC | PRN

## 2015-08-07 MED ORDER — OXYTOCIN 40 UNITS IN LACTATED RINGERS INFUSION - SIMPLE MED
1.0000 m[IU]/min | INTRAVENOUS | Status: DC
  Administered 2015-08-07: 2 m[IU]/min via INTRAVENOUS

## 2015-08-07 MED ORDER — LABETALOL HCL 5 MG/ML IV SOLN
20.0000 mg | INTRAVENOUS | Status: AC | PRN
  Administered 2015-08-07: 80 mg via INTRAVENOUS
  Administered 2015-08-07: 20 mg via INTRAVENOUS
  Administered 2015-08-07: 40 mg via INTRAVENOUS
  Filled 2015-08-07: qty 4
  Filled 2015-08-07: qty 16
  Filled 2015-08-07: qty 8

## 2015-08-07 MED ORDER — LACTATED RINGERS IV SOLN
INTRAVENOUS | Status: DC
  Administered 2015-08-07 (×2): via INTRAVENOUS

## 2015-08-07 NOTE — Progress Notes (Signed)
I assisted Lauren,RN with admission questions. Eda H Royal Interpreter.

## 2015-08-07 NOTE — Progress Notes (Signed)
Will medicated with labetalol per order

## 2015-08-07 NOTE — H&P (Signed)
LABOR AND DELIVERY ADMISSION HISTORY AND PHYSICAL NOTE  Rebecca Ruiz is a 40 y.o. female Spanish speaking [phone] with IUP at [redacted]w[redacted]d by LMP presenting for IOL. The pt has had limited prenatal care but has been seen 2 times by Dr. Ashok Pall. On her last visit with him (07/29/15) it was noted that she had mild pre-E and GDM and IOL was scheduled at that time. The patient has limited knowledge of these diagnoses.  She reports positive fetal movement. She denies leakage of fluid or vaginal bleeding.  Prenatal History/Complications:  Past Medical History: Past Medical History:  Diagnosis Date  . Cholestasis of pregnancy   . Pregnancy induced hypertension     Past Surgical History: Past Surgical History:  Procedure Laterality Date  . NO PAST SURGERIES      Obstetrical History: OB History    Gravida Para Term Preterm AB Living   7 5 3 2 1 5    SAB TAB Ectopic Multiple Live Births   1 0 0 0 5      Social History: Social History   Social History  . Marital status: Single    Spouse name: N/A  . Number of children: N/A  . Years of education: N/A   Social History Main Topics  . Smoking status: Never Smoker  . Smokeless tobacco: Never Used  . Alcohol use No  . Drug use: No  . Sexual activity: Not Currently   Other Topics Concern  . None   Social History Narrative  . None    Family History: Family History  Problem Relation Age of Onset  . Diabetes Mother   . Cancer Mother     colon    Allergies: Allergies  Allergen Reactions  . Penicillins Rash    Has patient had a PCN reaction causing immediate rash, facial/tongue/throat swelling, SOB or lightheadedness with hypotension: Yes Has patient had a PCN reaction causing severe rash involving mucus membranes or skin necrosis: Yes Has patient had a PCN reaction that required hospitalization No Has patient had a PCN reaction occurring within the last 10 years: No If all of the above answers are "NO", then may  proceed with Cephalosporin use.    Prescriptions Prior to Admission  Medication Sig Dispense Refill Last Dose  . Prenatal Vit-Fe Fumarate-FA (MULTIVITAMIN-PRENATAL) 27-0.8 MG TABS tablet Take 1 tablet by mouth daily at 12 noon.   08/06/2015  . ursodiol (ACTIGALL) 300 MG capsule Take 1 capsule (300 mg total) by mouth 3 (three) times daily. 16 capsule 0 08/06/2015     Review of Systems   All systems reviewed and negative except as stated in HPI  Blood pressure (!) 156/94, pulse (!) 59, temperature 98.3 F (36.8 C), temperature source Oral, resp. rate 18, height 5\' 5"  (1.651 m), weight 242 lb (109.8 kg), last menstrual period 11/20/2014.   General appearance: alert, cooperative and no distress Lungs: clear to auscultation bilaterally Heart: regular rate and rhythm Abdomen: soft, non-tender Extremities: No calf swelling or tenderness Presentation: cephalic Fetal monitoring: Mode External filed at 08/07/2015 0800  Baseline Rate (A) 135 bpm filed at 08/07/2015 0856  Variability 6-25 BPM filed at 08/07/2015 0856  Accelerations 15 x 15 filed at 08/07/2015 0856  Decelerations None filed at 08/07/2015 0856    Uterine activity: Mild Dilation: 2 Effacement (%): Thick Station: -3 Exam by:: Lujean Rave rN   Prenatal labs: ABO, Rh: AB/Positive/-- (03/09 0000) Antibody: Negative (03/09 0000) Rubella: !Error! RPR: Non Reactive (07/10 1051)  HBsAg: Negative (03/09 0000)  HIV: Non Reactive (07/10 1051)  GBS: Negative (07/17 0000)  1 hr Glucola:  Early 156, late reportedly nl per pt Genetic screening:  Quad abnl, NIPS wnl Anatomy US: wnl per report, boy  Prenatal Transfer Tool  Maternal Diabetes: Yes in records, but pt unaware Genetic Screening: Abnormal:  Results: Other: abnl quad Maternal Ultrasounds/Referrals: Normal Fetal Ultrasounds or other Referrals:  None Maternal Substance Abuse:  No Significant Maternal Medications:  No current facility-administered medications on  file prior to encounter.    Current Outpatient Prescriptions on File Prior to Encounter  Medication Sig Dispense Refill  . Prenatal Vit-Fe Fumarate-FA (MULTIVITAMIN-PRENATAL) 27-0.8 MG TABS tablet Take 1 tablet by mouth daily at 12 noon.    . ursodiol (ACTIGALL) 300 MG capsule Take 1 capsule (300 mg total) by mouth 3 (three) times daily. 16 capsule 0   Significant Maternal Lab Results: Lab values include: Group B Strep negative  Results for orders placed or performed during the hospital encounter of 08/07/15 (from the past 24 hour(s))  CBC   Collection Time: 08/07/15  7:50 AM  Result Value Ref Range   WBC 9.5 4.0 - 10.5 K/uL   RBC 4.03 3.87 - 5.11 MIL/uL   Hemoglobin 9.6 (L) 12.0 - 15.0 g/dL   HCT 62.1 (L) 30.8 - 65.7 %   MCV 74.4 (L) 78.0 - 100.0 fL   MCH 23.8 (L) 26.0 - 34.0 pg   MCHC 32.0 30.0 - 36.0 g/dL   RDW 84.6 (H) 96.2 - 95.2 %   Platelets 211 150 - 400 K/uL  Comprehensive metabolic panel   Collection Time: 08/07/15  7:50 AM  Result Value Ref Range   Sodium 133 (L) 135 - 145 mmol/L   Potassium 3.4 (L) 3.5 - 5.1 mmol/L   Chloride 106 101 - 111 mmol/L   CO2 20 (L) 22 - 32 mmol/L   Glucose, Bld 88 65 - 99 mg/dL   BUN 7 6 - 20 mg/dL   Creatinine, Ser 8.41 0.44 - 1.00 mg/dL   Calcium 8.1 (L) 8.9 - 10.3 mg/dL   Total Protein 5.6 (L) 6.5 - 8.1 g/dL   Albumin 2.4 (L) 3.5 - 5.0 g/dL   AST 20 15 - 41 U/L   ALT 16 14 - 54 U/L   Alkaline Phosphatase 140 (H) 38 - 126 U/L   Total Bilirubin 0.4 0.3 - 1.2 mg/dL   GFR calc non Af Amer >60 >60 mL/min   GFR calc Af Amer >60 >60 mL/min   Anion gap 7 5 - 15    Patient Active Problem List   Diagnosis Date Noted  . Mild pre-eclampsia 08/07/2015  . Cholestasis of pregnancy 08/01/2015  . Anemia affecting pregnancy 08/01/2015  . Gestational diabetes mellitus 07/29/2015  . Supervision of high-risk pregnancy 07/22/2015  . Mild preeclampsia 07/22/2015    Assessment: Rebecca Ruiz is a 40 y.o. [id number] at [redacted]w[redacted]d here for  IOL 2/2 w/ mild pre-E, cholestasis of pregnancy, and gestational diabetes.    #HTN: labetalol prn #Labor: 2x2 pit started,  foley #Pain: Fentanyl, may add epidural #FWB: Cat 1 #ID:  GBS neg #MOF: Breast #MOC: OCP #Circ:  Boy - no circ  Andres Ege MD, PGY-1, M[facility] 08/07/2015, 9:21 AM   OB FELLOW HISTORY AND PHYSICAL ATTESTATION  I have seen and examined this patient; I agree with above documentation in the resident's note.    Jen Mow, DO 08/07/2015, 3:07 PM

## 2015-08-07 NOTE — Anesthesia Pain Management Evaluation Note (Signed)
  CR[facility] Pain Management Visit Note  Patient: Rebecca Ruiz, 40 y.o., female  "Hello I am a member of the anesthesia team at [facility]. We have an anesthesia team available at all times to provide care throughout the hospital, including epidural management and anesthesia for C-section. I don't know your plan for the delivery whether it a natural birth, water birth, IV sedation, nitrous supplementation, doula or epidural, but we want to meet your pain goals."   1.Was your pain managed to your expectations on prior hospitalizations?   Yes   2.What is your expectation for pain management during this hospitalization?     Labor support without medications  3.How can we help you reach that goal? Be available if needed  Record the patient's initial score and the patient's pain goal.   Pain: 6  Pain Goal: 4 [facility] wants you to be able to say your pain was always managed very well.  [name] 08/07/2015

## 2015-08-07 NOTE — Progress Notes (Addendum)
L&D Note  08/07/2015 - 3:26 PM  39 y.o. [id number] [redacted]w[redacted]d . Pregnancy complicated by pre-eclampsia w/o severe features, cholestasis of pregnancy, GDM (likely a2)  [name] is admitted for all of the above and now recently diagnosed for pre-eclampsia with severe features (BP)   Subjective:  No issues  Objective:   Vitals:   08/07/15 1416 08/07/15 1454 08/07/15 1501 08/07/15 1516  BP: (!) 161/92 (!) 161/96 (!) 163/96 (!) 150/87  Pulse: 69 63 68 70  Resp: Temp: 98.6 F (37 C)   98.8 F (37.1 C)  TempSrc: Oral   Oral  Weight:      Height:        Current Vital Signs 24h Vital Sign Ranges  T 98.8 F (37.1 C) Temp  Avg: 98.5 F (36.9 C)  Min: 98.1 F (36.7 C)  Max: 98.8 F (37.1 C)  BP (!) 150/87 BP  Min: 148/100  Max: 170/87  HR 70 Pulse  Avg: 66.3  Min: 59  Max: 71  RR 18 Resp  Avg: 18  Min: 18  Max: 18  SaO2     No Data Recorded       24 Hour I/O Current Shift I/O  Time Ins Outs No intake/output data recorded. No intake/output data recorded.   FHR: 135 baseline, +accels, no decels, mod var Toco: q1-38m Gen: NAD SVE: 4/thick/high. Cephalic. Pelvis feels adequate.   Labs:   Recent Labs Lab 08/07/15 0750  WBC 9.5  HGB 9.6*  HCT 30.0*  PLT 211    Recent Labs Lab 08/07/15 0750  NA 133*  K 3.4*  CL 106  CO2 20*  BUN 7  CREATININE 0.44  CALCIUM 8.1*  PROT 5.6*  BILITOT 0.4  ALKPHOS 140*  ALT 16  AST 20  GLUCOSE 88   BS 1240pm: 104  Medications Current Facility-Administered Medications  Medication Dose Route Frequency Provider Last Rate Last Dose  . acetaminophen (TYLENOL) tablet 650 mg  650 mg Oral Q4H PRN Lorne Skeens, MD      . fentaNYL (SUBLIMAZE) injection 50-100 mcg  50-100 mcg Intravenous Q1H PRN Quenten Raven, MD      . hydrALAZINE (APRESOLINE) injection 10 mg  10 mg Intravenous Once PRN Quenten Raven, MD      . labetalol (NORMODYNE) tablet 100 mg  100 mg Oral BID Quenten Raven, MD      . labetalol  (NORMODYNE,TRANDATE) injection 20-80 mg  20-80 mg Intravenous Q10 min PRN Mayo Bing, MD      . lactated ringers infusion 500-1,000 mL  500-1,000 mL Intravenous PRN Lorne Skeens, MD      . lactated ringers infusion   Intravenous Continuous Lorne Skeens, MD 125 mL/hr at 08/07/15 1512    . lidocaine (PF) (XYLOCAINE) 1 % injection 30 mL  30 mL Subcutaneous PRN Lorne Skeens, MD      . magnesium bolus via infusion 4 g  4 g Intravenous Once [place] Bing, MD      . magnesium sulfate 40 g in lactated ringers 500 mL (0.08 g/mL) OB infusion  2 g/hr Intravenous Continuous  Bing, MD      . ondansetron (ZOFRAN) injection 4 mg  4 mg Intravenous Q6H PRN Lorne Skeens, MD      . oxyCODONE-acetaminophen (PERCOCET/ROXICET) 5-325 MG per tablet 1 tablet  1 tablet Oral Q4H PRN Lorne Skeens, MD      . oxyCODONE-acetaminophen (PERCOCET/ROXICET) [id number]  MG per tablet 2 tablet  2 tablet Oral Q4H PRN Lorne Skeens, MD      . oxytocin (PITOCIN) IV BOLUS FROM BAG  500 mL Intravenous Once Lorne Skeens, MD      . oxytocin (PITOCIN) IV infusion 40 units in LR 1000 mL - Premix  2.5 Units/hr Intravenous Continuous Lorne Skeens, MD      . oxytocin (PITOCIN) IV infusion 40 units in LR 1000 mL - Premix  1-40 milli-units/min Intravenous Titrated Quenten Raven, MD 15 mL/hr at 08/07/15 1229 10 milli-units/min at 08/07/15 1229  . sodium citrate-citric acid (ORACIT) solution 30 mL  30 mL Oral Q2H PRN Lorne Skeens, MD      . sodium phosphate (FLEET) 7-19 GM/118ML enema 1 enema  1 enema Rectal PRN Lorne Skeens, MD      . terbutaline (BRETHINE) injection 0.25 mg  0.25 mg Subcutaneous Once PRN Quenten Raven, MD      . terbutaline (BRETHINE) injection 0.25 mg  0.25 mg Subcutaneous Once PRN Quenten Raven, MD        Assessment & Plan:  Patient stable *IUP: category I with accels *ICP: see below *Severe pre-x:  borderline pressures even with IV anti-HTN meds. Will diagnose as severe now and do Mg and continue with IV meds per protocol *IOL: continue with pitocin. Try and AROM when able *GDM: late dx due to transfer of care and getting records. Normal q4h BS checks. Change to q2h with 6cm or greater -7/20: normal growth, AC/AFI @ efw 60% and AC 70% *GBS: neg *Analgesia: no needs currently.   Cornelia Copa MD Attending Center for [facility])

## 2015-08-07 NOTE — Progress Notes (Signed)
I assisted RN with questions, by Orlan Leavens Spanish Interpreter.

## 2015-08-08 ENCOUNTER — Encounter (HOSPITAL_COMMUNITY): Payer: Self-pay

## 2015-08-08 DIAGNOSIS — K831 Obstruction of bile duct: Secondary | ICD-10-CM

## 2015-08-08 DIAGNOSIS — O14 Pre-eclampsia: Secondary | ICD-10-CM

## 2015-08-08 DIAGNOSIS — O1404 Mild to moderate pre-eclampsia, complicating childbirth: Secondary | ICD-10-CM

## 2015-08-08 DIAGNOSIS — O2662 Liver and biliary tract disorders in childbirth: Secondary | ICD-10-CM

## 2015-08-08 DIAGNOSIS — Z3A37 37 weeks gestation of pregnancy: Secondary | ICD-10-CM

## 2015-08-08 DIAGNOSIS — O26649 Intrahepatic cholestasis of pregnancy, unspecified trimester: Secondary | ICD-10-CM

## 2015-08-08 DIAGNOSIS — O24419 Gestational diabetes mellitus in pregnancy, unspecified control: Secondary | ICD-10-CM

## 2015-08-08 DIAGNOSIS — O24429 Gestational diabetes mellitus in childbirth, unspecified control: Secondary | ICD-10-CM

## 2015-08-08 MED ORDER — COCONUT OIL OIL: 1.0000 "application " | TOPICAL_OIL | Status: DC | PRN

## 2015-08-08 MED ORDER — MISOPROSTOL 200 MCG PO TABS
800.0000 ug | ORAL_TABLET | Freq: Once | ORAL | Status: AC
  Administered 2015-08-08: 800 ug via ORAL
  Filled 2015-08-08: qty 4

## 2015-08-08 MED ORDER — DIPHENHYDRAMINE HCL 25 MG PO CAPS: 25.0000 mg | ORAL_CAPSULE | Freq: Four times a day (QID) | ORAL | Status: DC | PRN

## 2015-08-08 MED ORDER — ACETAMINOPHEN 325 MG PO TABS: 650.0000 mg | ORAL_TABLET | ORAL | Status: DC | PRN

## 2015-08-08 MED ORDER — SENNOSIDES-DOCUSATE SODIUM 8.6-50 MG PO TABS: 2.0000 | ORAL_TABLET | ORAL | Status: DC

## 2015-08-08 MED ORDER — LACTATED RINGERS IV SOLN
INTRAVENOUS | Status: DC
  Administered 2015-08-08 (×3): via INTRAVENOUS

## 2015-08-08 MED ORDER — TETANUS-DIPHTH-ACELL PERTUSSIS 5-2.5-18.5 LF-MCG/0.5 IM SUSP: 0.5000 mL | Freq: Once | INTRAMUSCULAR | Status: DC

## 2015-08-08 MED ORDER — DIBUCAINE 1 % RE OINT: 1.0000 "application " | TOPICAL_OINTMENT | RECTAL | Status: DC | PRN

## 2015-08-08 MED ORDER — WITCH HAZEL-GLYCERIN EX PADS: 1.0000 "application " | MEDICATED_PAD | CUTANEOUS | Status: DC | PRN

## 2015-08-08 MED ORDER — LIDOCAINE HCL (PF) 1 % IJ SOLN
INTRAMUSCULAR | Status: DC | PRN
  Administered 2015-08-07 (×2): 4 mL via EPIDURAL

## 2015-08-08 MED ORDER — BENZOCAINE-MENTHOL 20-0.5 % EX AERO: 1.0000 "application " | INHALATION_SPRAY | CUTANEOUS | Status: DC | PRN

## 2015-08-08 MED ORDER — ZOLPIDEM TARTRATE 5 MG PO TABS: 5.0000 mg | ORAL_TABLET | Freq: Every evening | ORAL | Status: DC | PRN

## 2015-08-08 MED ORDER — ONDANSETRON HCL 4 MG/2ML IJ SOLN: 4.0000 mg | INTRAMUSCULAR | Status: DC | PRN

## 2015-08-08 MED ORDER — SIMETHICONE 80 MG PO CHEW: 80.0000 mg | CHEWABLE_TABLET | ORAL | Status: DC | PRN

## 2015-08-08 MED ORDER — IBUPROFEN 600 MG PO TABS
600.0000 mg | ORAL_TABLET | Freq: Four times a day (QID) | ORAL | Status: DC
  Administered 2015-08-08 – 2015-08-09 (×8): 600 mg via ORAL
  Filled 2015-08-08 (×8): qty 1

## 2015-08-08 MED ORDER — ONDANSETRON HCL 4 MG PO TABS: 4.0000 mg | ORAL_TABLET | ORAL | Status: DC | PRN

## 2015-08-08 MED ORDER — PRENATAL MULTIVITAMIN CH
1.0000 | ORAL_TABLET | Freq: Every day | ORAL | Status: DC
  Administered 2015-08-08 – 2015-08-09 (×2): 1 via ORAL
  Filled 2015-08-08 (×2): qty 1

## 2015-08-08 NOTE — Anesthesia Procedure Notes (Signed)
Epidural Patient location during procedure: OB Start time: 08/07/2015 10:50 PM  Staffing Anesthesiologist: Shi Blankenship  Preanesthetic Checklist Completed: patient identified, site marked, surgical consent, pre-op evaluation, timeout performed, IV checked, risks and benefits discussed and monitors and equipment checked  Epidural Patient position: sitting Prep: site prepped and draped and DuraPrep Patient monitoring: continuous pulse ox and blood pressure Approach: midline Location: L3-L4 Injection technique: LOR saline  Needle:  Needle type: Tuohy  Needle gauge: 17 G Needle length: 9 cm and 9 Needle insertion depth: 5 cm cm Catheter type: closed end flexible Catheter size: 19 Gauge Catheter at skin depth: 10 cm Test dose: negative  Assessment Events: blood not aspirated, injection not painful, no injection resistance, negative IV test and no paresthesia

## 2015-08-08 NOTE — Anesthesia Preprocedure Evaluation (Addendum)
Anesthesia Evaluation  Patient identified by MRN, date of birth, ID band Patient awake    Reviewed: Allergy & Precautions, NPO status , Patient's Chart, lab work & pertinent test results  Airway Mallampati: I  TM Distance: >3 FB Neck ROM: Full    Dental  (+) Teeth Intact, Dental Advisory Given   Pulmonary    breath sounds clear to auscultation       Cardiovascular hypertension, Pt. on medications  Rhythm:Regular Rate:Normal     Neuro/Psych    GI/Hepatic   Endo/Other  Morbid obesity  Renal/GU      Musculoskeletal   Abdominal   Peds  Hematology   Anesthesia Other Findings   Reproductive/Obstetrics (+) Pregnancy                             Anesthesia Physical Anesthesia Plan  ASA: III  Anesthesia Plan: Epidural   Post-op Pain Management:    Induction:   Airway Management Planned:   Additional Equipment:   Intra-op Plan:   Post-operative Plan:   Informed Consent: I have reviewed the patients History and Physical, chart, labs and discussed the procedure including the risks, benefits and alternatives for the proposed anesthesia with the patient or authorized representative who has indicated his/her understanding and acceptance.   Dental advisory given  Plan Discussed with: Anesthesiologist  Anesthesia Plan Comments:         Anesthesia Quick Evaluation

## 2015-08-08 NOTE — Lactation Note (Signed)
This note was copied from a baby's chart. Lactation Consultation Note  Patient Name: Rebecca Ruiz Date: 08/08/2015 Reason for consult: Initial assessment Breastfeeding consultation services and support information given to mom in Spanish.  Mom both breast and formula fed her previous babies.  She desires to do the same with newborn.  Mom understands to always put baby to breat with feeding cues.  She states baby is nursing well and no questions at present.  Encouraged to call for concerns/assist prn.  Maternal Data Has patient been taught Hand Expression?: Yes Does the patient have breastfeeding experience prior to this delivery?: Yes  Feeding Feeding Type: Breast Fed Length of feed: 5 min  LATCH Score/Interventions Latch: Grasps breast easily, tongue down, lips flanged, rhythmical sucking. Intervention(s): Adjust position  Audible Swallowing: Spontaneous and intermittent Intervention(s): Skin to skin  Type of Nipple: Everted at rest and after stimulation  Comfort (Breast/Nipple): Soft / non-tender     Hold (Positioning): No assistance needed to correctly position infant at breast.  LATCH Score: 10  Lactation Tools Discussed/Used     Consult Status Consult Status: PRN    [name] 08/08/2015, 5:23 PM

## 2015-08-08 NOTE — Progress Notes (Signed)
LABOR PROGRESS NOTE  Rebecca Ruiz is a 40 y.o. [id number] at [redacted]w[redacted]d  admitted for IOL for preE.  Subjective: Pt resting comfortably.  Objective: BP 134/82   Pulse 82   Temp 98.7 F (37.1 C) (Oral)   Resp 18   Ht 5\' 5"  (1.651 m)   Wt 109.8 kg (242 lb)   LMP 11/20/2014 (Exact Date)   BMI 40.27 kg/m  or  Vitals:   08/07/15 2309 08/07/15 2313 08/07/15 2315 08/07/15 2318  BP:  (!) 142/82  134/82  Pulse: 76 74 76 82  Resp:  18    Temp:      TempSrc:      Weight:      Height:        Dilation: 5 Effacement (%): 80 Cervical Position: Middle Station: -2 Presentation: Vertex Exam by:: Rebecca Ruiz, CNM  Labs: Lab Results  Component Value Date   WBC 13.2 (H) 08/07/2015   HGB 10.3 (L) 08/07/2015   HCT 32.0 (L) 08/07/2015   MCV 74.6 (L) 08/07/2015   PLT 214 08/07/2015    Patient Active Problem List   Diagnosis Date Noted  . Mild pre-eclampsia 08/07/2015  . Cholestasis of pregnancy 08/01/2015  . Anemia affecting pregnancy 08/01/2015  . Gestational diabetes mellitus 07/29/2015  . Supervision of high-risk pregnancy 07/22/2015  . Mild preeclampsia 07/22/2015    Assessment / Plan: 40 y.o. [id number] at [redacted]w[redacted]d here for IOL for preE.  Labor: AROM@2350  Fetal Wellbeing:  Cat 1 Pain Control:  S/p epidural Anticipated MOD:  SVD  Rebecca Muse, MD 08/08/2015, 12:01 AM

## 2015-08-08 NOTE — Progress Notes (Signed)
I assisted Shannon,RN with questions.  Eda H Royal Interpreter.

## 2015-08-08 NOTE — Anesthesia Postprocedure Evaluation (Signed)
Anesthesia Post Note  Patient: Rebecca Ruiz  Procedure(s) Performed: * No procedures listed *  Patient location during evaluation: [facility] Anesthesia Type: Epidural Level of consciousness: awake and alert Pain management: pain level controlled Vital Signs Assessment: post-procedure vital signs reviewed and stable Respiratory status: spontaneous breathing Cardiovascular status: stable Postop Assessment: no headache, no backache, epidural receding and patient able to bend at knees Anesthetic complications: no (interpreter present)     Last Vitals:  Vitals:   08/08/15 0700 08/08/15 0751  BP: (!) 146/87 136/75  Pulse: (!) 102 92  Resp: 18 18  Temp: 37.2 C 37.2 C    Last Pain:  Vitals:   08/08/15 0751  TempSrc: Oral  PainSc: 0-No pain   Pain Goal: Patients Stated Pain Goal: 3 (08/08/15 0751)               Edison Pace

## 2015-08-09 MED ORDER — IBUPROFEN 800 MG PO TABS: 800.0000 mg | ORAL_TABLET | Freq: Three times a day (TID) | ORAL | 0 refills | Status: DC | PRN

## 2015-08-09 MED ORDER — AMLODIPINE BESYLATE 10 MG PO TABS
10.0000 mg | ORAL_TABLET | Freq: Every day | ORAL | Status: DC
  Filled 2015-08-09 (×2): qty 1

## 2015-08-09 NOTE — Discharge Instructions (Signed)
Parto vaginal, Cuidados posteriores  °(Vaginal Delivery, Care After) °Siga estas instrucciones durante las próximas semanas. Estas indicaciones para el alta le proporcionan información general acerca de cómo deberá cuidarse después del parto. El médico también podrá darle instrucciones específicas. El tratamiento ha sido planificado según las prácticas médicas actuales, pero en algunos casos pueden ocurrir problemas. Comuníquese con el médico si tiene algún problema o tiene preguntas al volver a su casa.  °INSTRUCCIONES PARA EL CUIDADO EN EL HOGAR  °· Tome sólo medicamentos de venta libre o recetados, según las indicaciones del médico o del farmacéutico. °· No beba alcohol, especialmente si está amamantando o toma analgésicos. °· No mastique tabaco ni fume. °· No consuma drogas. °· Continúe con un adecuado cuidado perineal. El buen cuidado perineal incluye: °¨ Higienizarse de adelante hacia atrás. °¨ Mantener la zona perineal limpia. °· No use tampones ni duchas vaginales hasta que su médico la autorice. °· Dúchese, lávese el cabello y tome baños de inmersión según las indicaciones de su médico. °· Utilice un sostén que le ajuste bien y que brinde buen soporte a sus mamas. °· Consuma alimentos saludables. °· Beba suficiente líquido para mantener la orina clara o de color amarillo pálido. °· Consuma alimentos ricos en fibra como cereales y panes integrales, arroz, frijoles y frutas y verduras frescas todos los días. Estos alimentos pueden ayudarla a prevenir o aliviar el estreñimiento. °· Siga las recomendaciones de su médico relacionadas con la reanudación de actividades como subir escaleras, conducir automóviles, levantar objetos, hacer ejercicios o viajar. °· Hable con su médico acerca de reanudar la actividad sexual. Volver a la actividad sexual depende del riesgo de infección, la velocidad de la curación y la comodidad y su deseo de reanudarla. °· Trate de que alguien la ayude con las actividades del hogar y con  el recién nacido al menos durante un par de días después de salir del hospital. °· Descanse todo lo que pueda. Trate de descansar o tomar una siesta mientras el bebé está durmiendo. °· Aumente sus actividades gradualmente. °· Cumpla con todas las visitas de control programadas para después del parto. Es muy importante asistir a todas las citas programadas de seguimiento. En estas citas, su médico va a controlarla para asegurarse de que esté sanando física y emocionalmente. °SOLICITE ATENCIÓN MÉDICA SI:  °· Elimina coágulos grandes por la vagina. Guarde algunos coágulos para mostrarle al médico. °· Tiene una secreción con feo olor que proviene de la vagina. °· Tiene dificultad para orinar. °· Orina con frecuencia. °· Siente dolor al orinar. °· Nota un cambio en sus movimientos intestinales. °· Aumenta el enrojecimiento, el dolor o la hinchazón en la zona de la incisión vaginal (episiotomía) o el desgarro vaginal. °· Tiene pus que drena por la episiotomía o el desgarro vaginal. °· La episiotomía o el desgarro vaginal se abren. °· Sus mamas le duelen, están duras o enrojecidas. °· Sufre un dolor intenso de cabeza. °· Tiene visión borrosa o ve manchas. °· Se siente triste o deprimida. °· Tiene pensamientos acerca de lastimarse o dañar al recién nacido. °· Tiene preguntas acerca de su cuidado personal, el cuidado del recién nacido o acerca de los medicamentos. °· Se siente mareada o sufre un desmayo. °· Tiene una erupción. °· Tiene náuseas o vómitos. °· Usted amamantó al bebé y no ha tenido su período menstrual dentro de las 12 semanas después de dejar de amamantar. °· No amamanta al bebé y no tuvo su período menstrual en las últimas 12° semanas después del   parto. °· Tiene fiebre. °SOLICITE ATENCIÓN MÉDICA DE INMEDIATO SI:  °· Siente dolor persistente. °· Siente dolor en el pecho. °· Le falta el aire. °· Se desmaya. °· Siente dolor en la pierna. °· Siente dolor en el estómago. °· El sangrado vaginal satura dos o más  apósitos en 1 hora. °  °Esta información no tiene como fin reemplazar el consejo del médico. Asegúrese de hacerle al médico cualquier pregunta que tenga. °  °Document Released: 12/29/2004 Document Revised: 09/19/2014 °Elsevier Interactive Patient Education ©2016 Elsevier Inc. ° °

## 2015-08-09 NOTE — Progress Notes (Signed)
Pt was taken down to nursery for hearing screen and nursery nurse called that baby was spitting up bright green . MD was notified and baby was kept at nursery for monitoring for a couple of hours. Interpreter was called informed parents about this. Mom stated she did understand. Baby brought back to mom and advised to keep baby on side to prevent aspiration.

## 2015-08-09 NOTE — Lactation Note (Signed)
This note was copied from a baby's chart. Lactation Consultation Note  Patient Name: Rebecca Ruiz [name] Date: 08/09/2015  Follow up visit made.  Mom concerned her milk is not in yet.  Baby is 9 hours old.  Explained to mom that milk volume should increase between 3-5 days postpartum.  Instructed to always put baby to breast with any feeding cue to stimulate milk supply.  Encouraged to call with questions/assist prn.   Maternal Data    Feeding    LATCH Score/Interventions                      Lactation Tools Discussed/Used     Consult Status      [name] 08/09/2015, 10:44 AM

## 2015-08-09 NOTE — Progress Notes (Signed)
I assisted Heather,CNM with explanation of care plan.  Eda H Royal Interpreter.

## 2015-08-09 NOTE — Discharge Summary (Signed)
OB Discharge Summary     Patient Name: Pheonix Langelier DOB: 1975/02/12 MRN: [id number]  Date of admission: 08/07/2015 Delivering MD:     Date of discharge: 08/09/2015  Admitting diagnosis: INDUCTION Intrauterine pregnancy: [redacted]w[redacted]d     Secondary diagnosis:  Active Problems:   Mild pre-eclampsia  Additional problems: A1GDM, Cholestasis      Discharge diagnosis: Term Pregnancy Delivered                                                                                                Post partum procedures:none  Augmentation: AROM  Complications: None  Hospital course:  Induction of Labor With Vaginal Delivery   40 y.o. yo [id number] at [redacted]w[redacted]d was admitted to the hospital 08/07/2015 for induction of labor.  Indication for induction: Preeclampsia, A1 DM and Cholestasis of pregnancy.  Patient had an uncomplicated labor course as follows: Membrane Rupture Time/Date: 11:55 PM ,08/07/2015   Intrapartum Procedures: Episiotomy: None [1]                                         Lacerations:  None [1]  Patient had delivery of a Viable infant.  Information for the patient's newborn:  [place], Laneve [[id number]]  Delivery Method: Vag-Spont   08/08/2015  Details of delivery can be found in separate delivery note.  Patient had a routine postpartum course. Patient is discharged home 08/09/15.   Physical exam Vitals:   08/08/15 2320 08/09/15 0017 08/09/15 0127 08/09/15 0605  BP: 139/83 (!) 144/86 (!) 144/81 (!) 142/71  Pulse: 94 91 84 83  Resp: 18 18 18 18   Temp:   97.9 F (36.6 C) 98.4 F (36.9 C)  TempSrc:   Oral Oral  SpO2: 100% 95% 96% 96%  Weight:    243 lb 12 oz (110.6 kg)  Height:       General: alert, cooperative and no distress Lochia: appropriate Uterine Fundus: firm Incision: N/A DVT Evaluation: No evidence of DVT seen on physical exam. Labs: Lab Results  Component Value Date   WBC 13.2 (H) 08/07/2015   HGB 10.3 (L) 08/07/2015   HCT 32.0 (L)  08/07/2015   MCV 74.6 (L) 08/07/2015   PLT 214 08/07/2015   CMP Latest Ref Rng & Units 08/07/2015  Glucose 65 - 99 mg/dL 88  BUN 6 - 20 mg/dL 7  Creatinine 1.74 - 9.44 mg/dL 9.67  Sodium 591 - 638 mmol/L 133(L)  Potassium 3.5 - 5.1 mmol/L 3.4(L)  Chloride 101 - 111 mmol/L 106  CO2 22 - 32 mmol/L 20(L)  Calcium 8.9 - 10.3 mg/dL 8.1(L)  Total Protein 6.5 - 8.1 g/dL 4.6(K)  Total Bilirubin 0.3 - 1.2 mg/dL 0.4  Alkaline Phos 38 - 126 U/L 140(H)  AST 15 - 41 U/L 20  ALT 14 - 54 U/L 16    Discharge instruction: per After Visit Summary and "Baby and Me Booklet".  After visit meds:    Medication List    TAKE these medications  ibuprofen 800 MG tablet Commonly known as:  ADVIL,MOTRIN Take 1 tablet (800 mg total) by mouth every 8 (eight) hours as needed.   multivitamin-prenatal 27-0.8 MG Tabs tablet Take 1 tablet by mouth daily at 12 noon.   ursodiol 300 MG capsule Commonly known as:  ACTIGALL Take 1 capsule (300 mg total) by mouth 3 (three) times daily.       Diet: routine diet  Activity: Advance as tolerated. Pelvic rest for 6 weeks.   Outpatient follow up:6 weeks Follow up Appt:No future appointments. Follow up Visit:No Follow-up on file.  Postpartum contraception: Not Discussed  Newborn Data: Live born female  Birth Weight: 7 lb 2.8 oz (3255 g) APGAR: 6, 8  Baby Feeding: Breast Disposition:home with mother   08/09/2015 Tawnya Crook, CNM

## 2015-08-10 ENCOUNTER — Ambulatory Visit: Payer: Self-pay

## 2015-08-10 NOTE — Lactation Note (Signed)
This note was copied from a baby's chart. Lactation Consultation Note; Experienced BF mom. Dad states BF is going well. Breasts are fuller today. Encouraged no formula States no pain with nursing. Baby asleep at mom's side.   Patient Name: Boy Manette Bayley MCNOB'S Date: 08/10/2015 Reason for consult: Follow-up assessment   Maternal Data Formula Feeding for Exclusion: Yes  Feeding    LATCH Score/Interventions                      Lactation Tools Discussed/Used     Consult Status Consult Status: Complete    Pamelia Hoit 08/10/2015, 10:15 AM

## 2015-08-12 ENCOUNTER — Encounter: Payer: Self-pay | Admitting: *Deleted

## 2015-08-30 ENCOUNTER — Encounter: Payer: Self-pay | Admitting: *Deleted

## 2015-10-07 ENCOUNTER — Ambulatory Visit: Payer: Self-pay | Admitting: Advanced Practice Midwife

## 2016-10-19 ENCOUNTER — Ambulatory Visit (INDEPENDENT_AMBULATORY_CARE_PROVIDER_SITE_OTHER): Payer: Self-pay | Admitting: Obstetrics and Gynecology

## 2016-10-19 ENCOUNTER — Encounter: Payer: Self-pay | Admitting: Obstetrics and Gynecology

## 2016-10-19 VITALS — BP 103/80 | HR 82 | Ht 63.0 in | Wt 239.0 lb

## 2016-10-19 DIAGNOSIS — O09522 Supervision of elderly multigravida, second trimester: Secondary | ICD-10-CM

## 2016-10-19 DIAGNOSIS — Z23 Encounter for immunization: Secondary | ICD-10-CM

## 2016-10-19 DIAGNOSIS — Z124 Encounter for screening for malignant neoplasm of cervix: Secondary | ICD-10-CM

## 2016-10-19 DIAGNOSIS — Z8759 Personal history of other complications of pregnancy, childbirth and the puerperium: Secondary | ICD-10-CM

## 2016-10-19 DIAGNOSIS — O0992 Supervision of high risk pregnancy, unspecified, second trimester: Secondary | ICD-10-CM

## 2016-10-19 DIAGNOSIS — O09529 Supervision of elderly multigravida, unspecified trimester: Secondary | ICD-10-CM | POA: Insufficient documentation

## 2016-10-19 DIAGNOSIS — Z641 Problems related to multiparity: Secondary | ICD-10-CM

## 2016-10-19 DIAGNOSIS — Z8632 Personal history of gestational diabetes: Secondary | ICD-10-CM | POA: Insufficient documentation

## 2016-10-19 DIAGNOSIS — Z8751 Personal history of pre-term labor: Secondary | ICD-10-CM | POA: Insufficient documentation

## 2016-10-19 DIAGNOSIS — Z1151 Encounter for screening for human papillomavirus (HPV): Secondary | ICD-10-CM

## 2016-10-19 DIAGNOSIS — O0990 Supervision of high risk pregnancy, unspecified, unspecified trimester: Secondary | ICD-10-CM | POA: Insufficient documentation

## 2016-10-19 DIAGNOSIS — Z113 Encounter for screening for infections with a predominantly sexual mode of transmission: Secondary | ICD-10-CM

## 2016-10-19 LAB — POCT URINALYSIS DIP (DEVICE)
Bilirubin Urine: NEGATIVE
Glucose, UA: NEGATIVE mg/dL
Hgb urine dipstick: NEGATIVE
Ketones, ur: 15 mg/dL — AB
Leukocytes, UA: NEGATIVE
NITRITE: NEGATIVE
PH: 7 (ref 5.0–8.0)
PROTEIN: NEGATIVE mg/dL
Specific Gravity, Urine: 1.02 (ref 1.005–1.030)
Urobilinogen, UA: 1 mg/dL (ref 0.0–1.0)

## 2016-10-19 LAB — GLUCOSE, CAPILLARY: GLUCOSE-CAPILLARY: 89 mg/dL (ref 65–99)

## 2016-10-19 NOTE — Progress Notes (Addendum)
Interview done with spanish interpretor.  Subjective:    Rebecca Ruiz is being seen today for her first obstetrical visit.   She is at 87w5dgestation. Her obstetrical history is significant for advanced maternal age, pre-eclampsia and cholestasis of pregnancy, gestational diabetes. Relationship with FOB: significant other, living together. Patient does intend to breast feed. Pregnancy history fully reviewed.  Patient reports no complaints. Daughter is with her.   Past Medical History:  Diagnosis Date  . Cholestasis of pregnancy   . Pregnancy induced hypertension    OB History    Gravida Para Term Preterm AB Living   8 6 4 2 1 6    SAB TAB Ectopic Multiple Live Births   1 0 0 0 6     Past Surgical History:  Procedure Laterality Date  . NO PAST SURGERIES     Family History  Problem Relation Age of Onset  . Diabetes Mother   . Cancer Mother        colon   Social History   Social History  . Marital status: Single    Spouse name: N/A  . Number of children: N/A  . Years of education: N/A   Occupational History  . Not on file.   Social History Main Topics  . Smoking status: Never Smoker  . Smokeless tobacco: Never Used  . Alcohol use No  . Drug use: No  . Sexual activity: Yes   Other Topics Concern  . Not on file   Social History Narrative  . No narrative on file     Review of Systems:   Review of Systems  All other systems reviewed and are negative.   Objective:     BP 103/80   Pulse 82   Ht 5' 3"  (1.6 m)   Wt 239 lb (108.4 kg)   LMP 05/20/2016 (Exact Date)   BMI 42.34 kg/m  Physical Exam  Constitutional: She is oriented to person, place, and time. She appears well-developed and well-nourished.  HENT:  Head: Normocephalic and atraumatic.  Right Ear: External ear normal.  Left Ear: External ear normal.  Nose: Nose normal.  Mouth/Throat: Oropharynx is clear and moist.  Eyes: Pupils are equal, round, and reactive to light.  Conjunctivae and EOM are normal.  Neck: Normal range of motion. Neck supple.  Cardiovascular: Normal rate, regular rhythm, normal heart sounds and intact distal pulses.   Respiratory: Effort normal and breath sounds normal.  GI: Soft.  Genitourinary: Vagina normal and uterus normal.  Genitourinary Comments: 21 weeks sized uterus Normal appearing multiparous cervix  Musculoskeletal: Normal range of motion.  Neurological: She is alert and oriented to person, place, and time. She has normal reflexes.  Skin: Skin is warm and dry.  Psychiatric: She has a normal mood and affect. Her behavior is normal. Judgment and thought content normal.    Maternal Exam:  Abdomen: Patient reports no abdominal tenderness. Fundal height is 21 weeks.    Introitus: Normal vulva. Normal vagina.  Cervix: Cervix evaluated by sterile speculum exam.        Assessment:    Pregnancy: [id number] Active Problem List   Diagnosis Date Noted  . History of pre-eclampsia 10/19/2016  . Advanced maternal age in multigravida 10/19/2016  . History of preterm delivery 10/19/2016  . [place] 10/19[place]  . Supervision of high risk pregnancy, antepartum 10/19/2016  . History of gestational diabetes 10/19/2016       Plan:     1. History of pre-eclampsia - reviewed  importance of monitoring BP during pregnancy - Comp Met (CMET) - Protein / Creatinine Ratio, Urine  2. Elderly multigravida in second trimester - regular [place] - due for mammogram, ordered  3. History of preterm delivery Reports delivery after very high fever, ?typhoid Reports 35 weeks delivery was induction Presents late for [facility] therapy Will offer vaginal prometrium  4. [place] multiparity  5. Supervision of high risk pregnancy, antepartum - Obstetric Panel, Including HIV - Hemoglobinopathy Evaluation - Culture, OB Urine - HgB A1c - Cytology - PAP - SMN1 Copy Number Analysis - Flu Vaccine QUAD 36+ mos IM (Fluarix, Quad PF) -  Cystic fibrosis gene test - [place] MFM OB DETAIL +14 WK; Future - QUAD offered and patient agreeable  6. History of gestational diabetes Denies ever being told she had diabetes HgA1C today  7. H/o cholestasis Asymptomatic today  Initial labs drawn. Prenatal vitamins. Problem list reviewed and updated. Role of ultrasound in pregnancy discussed; fetal survey: ordered. Follow up in 3 weeks. 50% of 40 min visit spent on counseling and coordination of care.     Sloan Leiter 10/19/2016

## 2016-10-20 ENCOUNTER — Telehealth: Payer: Self-pay | Admitting: Obstetrics and Gynecology

## 2016-10-20 LAB — PROTEIN / CREATININE RATIO, URINE
Creatinine, Urine: 96.8 mg/dL
PROTEIN UR: 16.1 mg/dL
PROTEIN/CREAT RATIO: 166 mg/g{creat} (ref 0–200)

## 2016-10-20 NOTE — Addendum Note (Signed)
Addended by: Leroy Libman on: 10/20/2016 09:06 AM   Modules accepted: Orders

## 2016-10-20 NOTE — Telephone Encounter (Signed)
After further discussion, decided to offer patient prometrium for history of preterm delivery. Have added on TVUS with MFM anatomy. Attempted to call patient this am but number was busy x3. Will try again later.    Baldemar Lenis, M.D. Attending Obstetrician & Gynecologist, [facility] for [employer], [facility]

## 2016-10-20 NOTE — Telephone Encounter (Signed)
Attempted to contact patient regarding vaginal prometrium, number remains busy. Will try again later.   Baldemar Lenis, M.D. Attending Obstetrician & Gynecologist, [facility] for [employer], [facility]

## 2016-10-21 LAB — CYTOLOGY - PAP
Chlamydia: NEGATIVE
DIAGNOSIS: NEGATIVE
HPV: NOT DETECTED
NEISSERIA GONORRHEA: NEGATIVE

## 2016-10-21 LAB — CULTURE, OB URINE

## 2016-10-21 LAB — URINE CULTURE, OB REFLEX

## 2016-10-23 LAB — AFP TETRA
DIA Mom Value: 1.21
DIA VALUE (EIA): 234.89 pg/mL
DSR (BY AGE) 1 IN: 81
DSR (SECOND TRIMESTER) 1 IN: 298
Gestational Age: 21.7 WEEKS
MSAFP MOM: 0.92
MSAFP: 49.1 ng/mL
MSHCG Mom: 1.11
MSHCG: 18673 m[IU]/mL
Maternal Age At EDD: 40.3 yr
Osb Risk: 10000
TEST RESULTS AFP: NEGATIVE
WEIGHT: 239 [lb_av]
uE3 Mom: 1.13
uE3 Value: 2.26 ng/mL

## 2016-10-23 LAB — HEMOGLOBINOPATHY EVALUATION
FERRITIN: 42 ng/mL (ref 15–150)
HGB A2 QUANT: 2.8 % (ref 1.8–3.2)
HGB A: 97.2 % (ref 96.4–98.8)
HGB C: 0 %
HGB F QUANT: 0 % (ref 0.0–2.0)
HGB S: 0 %
Hgb Solubility: NEGATIVE
Hgb Variant: 0 %

## 2016-10-23 LAB — OBSTETRIC PANEL, INCLUDING HIV
Antibody Screen: NEGATIVE
BASOS ABS: 0 10*3/uL (ref 0.0–0.2)
BASOS: 0 %
EOS (ABSOLUTE): 0.1 10*3/uL (ref 0.0–0.4)
Eos: 1 %
HEMATOCRIT: 34.8 % (ref 34.0–46.6)
HIV Screen 4th Generation wRfx: NONREACTIVE
Hemoglobin: 11.6 g/dL (ref 11.1–15.9)
Hepatitis B Surface Ag: NEGATIVE
IMMATURE GRANS (ABS): 0.1 10*3/uL (ref 0.0–0.1)
IMMATURE GRANULOCYTES: 1 %
LYMPHS ABS: 1.6 10*3/uL (ref 0.7–3.1)
LYMPHS: 20 %
MCH: 28.4 pg (ref 26.6–33.0)
MCHC: 33.3 g/dL (ref 31.5–35.7)
MCV: 85 fL (ref 79–97)
Monocytes Absolute: 0.3 10*3/uL (ref 0.1–0.9)
Monocytes: 4 %
NEUTROS ABS: 6.1 10*3/uL (ref 1.4–7.0)
Neutrophils: 74 %
Platelets: 216 10*3/uL (ref 150–379)
RBC: 4.09 x10E6/uL (ref 3.77–5.28)
RDW: 15.7 % — AB (ref 12.3–15.4)
RH TYPE: POSITIVE
RPR Ser Ql: NONREACTIVE
RUBELLA: 6.22 {index} (ref >0.99)
WBC: 8.2 10*3/uL (ref 3.4–10.8)

## 2016-10-23 LAB — HEMOGLOBIN A1C
Est. average glucose Bld gHb Est-mCnc: 100 mg/dL
Hgb A1c MFr Bld: 5.1 % (ref 4.8–5.6)

## 2016-10-23 LAB — COMPREHENSIVE METABOLIC PANEL
ALK PHOS: 120 IU/L — AB (ref 39–117)
ALT: 28 IU/L (ref 0–32)
AST: 31 IU/L (ref 0–40)
Albumin/Globulin Ratio: 1.5 (ref 1.2–2.2)
Albumin: 4 g/dL (ref 3.5–5.5)
BILIRUBIN TOTAL: 0.4 mg/dL (ref 0.0–1.2)
BUN / CREAT RATIO: 10 (ref 9–23)
BUN: 4 mg/dL — AB (ref 6–24)
CHLORIDE: 101 mmol/L (ref 96–106)
CO2: 18 mmol/L — ABNORMAL LOW (ref 20–29)
Calcium: 9 mg/dL (ref 8.7–10.2)
Creatinine, Ser: 0.41 mg/dL — ABNORMAL LOW (ref 0.57–1.00)
GFR calc Af Amer: 149 mL/min/{1.73_m2} (ref >59)
GFR calc non Af Amer: 130 mL/min/{1.73_m2} (ref >59)
GLUCOSE: 80 mg/dL (ref 65–99)
Globulin, Total: 2.7 g/dL (ref 1.5–4.5)
Potassium: 3.6 mmol/L (ref 3.5–5.2)
Sodium: 137 mmol/L (ref 134–144)
Total Protein: 6.7 g/dL (ref 6.0–8.5)

## 2016-10-23 LAB — CYSTIC FIBROSIS GENE TEST

## 2016-10-26 LAB — SMN1 COPY NUMBER ANALYSIS (SMA CARRIER SCREENING)

## 2016-10-30 ENCOUNTER — Other Ambulatory Visit (HOSPITAL_COMMUNITY): Payer: Self-pay | Admitting: *Deleted

## 2016-10-30 ENCOUNTER — Ambulatory Visit (HOSPITAL_COMMUNITY)
Admission: RE | Admit: 2016-10-30 | Discharge: 2016-10-30 | Disposition: A | Discharge status: Home / Self Care | Payer: Self-pay | Source: Ambulatory Visit | Attending: Obstetrics and Gynecology | Admitting: Obstetrics and Gynecology

## 2016-10-30 ENCOUNTER — Other Ambulatory Visit: Payer: Self-pay | Admitting: Obstetrics and Gynecology

## 2016-10-30 ENCOUNTER — Encounter (HOSPITAL_COMMUNITY): Payer: Self-pay

## 2016-10-30 DIAGNOSIS — O09292 Supervision of pregnancy with other poor reproductive or obstetric history, second trimester: Secondary | ICD-10-CM

## 2016-10-30 DIAGNOSIS — O09899 Supervision of other high risk pregnancies, unspecified trimester: Secondary | ICD-10-CM

## 2016-10-30 DIAGNOSIS — Z8751 Personal history of pre-term labor: Secondary | ICD-10-CM

## 2016-10-30 DIAGNOSIS — Z8632 Personal history of gestational diabetes: Secondary | ICD-10-CM

## 2016-10-30 DIAGNOSIS — Z3A23 23 weeks gestation of pregnancy: Secondary | ICD-10-CM

## 2016-10-30 DIAGNOSIS — O0990 Supervision of high risk pregnancy, unspecified, unspecified trimester: Secondary | ICD-10-CM

## 2016-10-30 DIAGNOSIS — O09523 Supervision of elderly multigravida, third trimester: Secondary | ICD-10-CM

## 2016-10-30 DIAGNOSIS — Z641 Problems related to multiparity: Secondary | ICD-10-CM

## 2016-10-30 DIAGNOSIS — Z3689 Encounter for other specified antenatal screening: Secondary | ICD-10-CM

## 2016-10-30 DIAGNOSIS — Z8759 Personal history of other complications of pregnancy, childbirth and the puerperium: Secondary | ICD-10-CM

## 2016-10-30 DIAGNOSIS — O09522 Supervision of elderly multigravida, second trimester: Secondary | ICD-10-CM

## 2016-10-30 DIAGNOSIS — O09219 Supervision of pregnancy with history of pre-term labor, unspecified trimester: Secondary | ICD-10-CM

## 2016-10-30 DIAGNOSIS — O09299 Supervision of pregnancy with other poor reproductive or obstetric history, unspecified trimester: Secondary | ICD-10-CM | POA: Insufficient documentation

## 2016-10-30 NOTE — ED Notes (Signed)
Rebecca Ruiz present for interpreting. 

## 2016-11-09 ENCOUNTER — Ambulatory Visit (INDEPENDENT_AMBULATORY_CARE_PROVIDER_SITE_OTHER): Payer: Self-pay | Admitting: Advanced Practice Midwife

## 2016-11-09 VITALS — BP 111/55 | HR 82 | Wt 242.5 lb

## 2016-11-09 DIAGNOSIS — O0992 Supervision of high risk pregnancy, unspecified, second trimester: Secondary | ICD-10-CM

## 2016-11-09 DIAGNOSIS — O0990 Supervision of high risk pregnancy, unspecified, unspecified trimester: Secondary | ICD-10-CM

## 2016-11-09 DIAGNOSIS — Z8759 Personal history of other complications of pregnancy, childbirth and the puerperium: Secondary | ICD-10-CM

## 2016-11-09 MED ORDER — ASPIRIN 81 MG PO CHEW: 81.0000 mg | CHEWABLE_TABLET | Freq: Every day | ORAL | 5 refills | Status: DC

## 2016-11-09 NOTE — Progress Notes (Signed)
   PRENATAL VISIT NOTE  Subjective:  Rebecca Ruiz is a 41 y.o. [phone] at 3323w5d being seen today for ongoing prenatal care.  She is currently monitored for the following issues for this high-risk pregnancy and has History of pre-eclampsia; Advanced maternal age in multigravida; History of preterm delivery; Grand multiparity; Supervision of high risk pregnancy, antepartum; and History of gestational diabetes on her problem list.  Patient reports no complaints.  Contractions: Not present. Vag. Bleeding: None.  Movement: Present. Denies leaking of fluid.   The following portions of the patient's history were reviewed and updated as appropriate: allergies, current medications, past family history, past medical history, past social history, past surgical history and problem list. Problem list updated.  Objective:   Vitals:   11/09/16 1440  BP: (!) 111/55  Pulse: 82  Weight: 242 lb 8 oz (110 kg)    Fetal Status: Fetal Heart Rate (bpm): 149 Fundal Height: 26 cm Movement: Present     General:  Alert, oriented and cooperative. Patient is in no acute distress.  Skin: Skin is warm and dry. No rash noted.   Cardiovascular: Normal heart rate noted  Respiratory: Normal respiratory effort, no problems with respiration noted  Abdomen: Soft, gravid, appropriate for gestational age.  Pain/Pressure: Absent     Pelvic: Cervical exam deferred        Extremities: Normal range of motion.  Edema: Trace  Mental Status:  Normal mood and affect. Normal behavior. Normal judgment and thought content.   Assessment and Plan:  Pregnancy: [id number] at 4223w5d  1. History of pre-eclampsia - RX sent for baby ASA  2. Supervision of high risk pregnancy, antepartum - GTT at next visit  - Serial US for growth US in 3rd trimester per MFM.   Preterm labor symptoms and general obstetric precautions including but not limited to vaginal bleeding, contractions, leaking of fluid and fetal movement were reviewed  in detail with the patient. Please refer to After Visit Summary for other counseling recommendations.  Return in about 2 weeks (around 11/23/2016).   Rebecca Ruiz, CNM

## 2016-11-09 NOTE — Progress Notes (Signed)
Spanish audio interpreter [phone] used for visit

## 2016-11-09 NOTE — Patient Instructions (Signed)
Third Trimester of Pregnancy The third trimester is from week 28 through week 40 (months 7 through 9). The third trimester is a time when the unborn baby (fetus) is growing rapidly. At the end of the ninth month, the fetus is about 20 inches in length and weighs 6-10 pounds. Body changes during your third trimester Your body will continue to go through many changes during pregnancy. The changes vary from woman to woman. During the third trimester:  Your weight will continue to increase. You can expect to gain 25-35 pounds (11-16 kg) by the end of the pregnancy.  You may begin to get stretch marks on your hips, abdomen, and breasts.  You may urinate more often because the fetus is moving lower into your pelvis and pressing on your bladder.  You may develop or continue to have heartburn. This is caused by increased hormones that slow down muscles in the digestive tract.  You may develop or continue to have constipation because increased hormones slow digestion and cause the muscles that push waste through your intestines to relax.  You may develop hemorrhoids. These are swollen veins (varicose veins) in the rectum that can itch or be painful.  You may develop swollen, bulging veins (varicose veins) in your legs.  You may have increased body aches in the pelvis, back, or thighs. This is due to weight gain and increased hormones that are relaxing your joints.  You may have changes in your hair. These can include thickening of your hair, rapid growth, and changes in texture. Some women also have hair loss during or after pregnancy, or hair that feels dry or thin. Your hair will most likely return to normal after your baby is born.  Your breasts will continue to grow and they will continue to become tender. A yellow fluid (colostrum) may leak from your breasts. This is the first milk you are producing for your baby.  Your belly button may stick out.  You may notice more swelling in your hands,  face, or ankles.  You may have increased tingling or numbness in your hands, arms, and legs. The skin on your belly may also feel numb.  You may feel short of breath because of your expanding uterus.  You may have more problems sleeping. This can be caused by the size of your belly, increased need to urinate, and an increase in your body's metabolism.  You may notice the fetus "dropping," or moving lower in your abdomen (lightening).  You may have increased vaginal discharge.  You may notice your joints feel loose and you may have pain around your pelvic bone.  What to expect at prenatal visits You will have prenatal exams every 2 weeks until week 36. Then you will have weekly prenatal exams. During a routine prenatal visit:  You will be weighed to make sure you and the baby are growing normally.  Your blood pressure will be taken.  Your abdomen will be measured to track your baby's growth.  The fetal heartbeat will be listened to.  Any test results from the previous visit will be discussed.  You may have a cervical check near your due date to see if your cervix has softened or thinned (effaced).  You will be tested for Group B streptococcus. This happens between 35 and 37 weeks.  Your health care provider may ask you:  What your birth plan is.  How you are feeling.  If you are feeling the baby move.  If you have had   any abnormal symptoms, such as leaking fluid, bleeding, severe headaches, or abdominal cramping.  If you are using any tobacco products, including cigarettes, chewing tobacco, and electronic cigarettes.  If you have any questions.  Other tests or screenings that may be performed during your third trimester include:  Blood tests that check for low iron levels (anemia).  Fetal testing to check the health, activity level, and growth of the fetus. Testing is done if you have certain medical conditions or if there are problems during the  pregnancy.  Nonstress test (NST). This test checks the health of your baby to make sure there are no signs of problems, such as the baby not getting enough oxygen. During this test, a belt is placed around your belly. The baby is made to move, and its heart rate is monitored during movement.  What is false labor? False labor is a condition in which you feel small, irregular tightenings of the muscles in the womb (contractions) that usually go away with rest, changing position, or drinking water. These are called Braxton Hicks contractions. Contractions may last for hours, days, or even weeks before true labor sets in. If contractions come at regular intervals, become more frequent, increase in intensity, or become painful, you should see your health care provider. What are the signs of labor?  Abdominal cramps.  Regular contractions that start at 10 minutes apart and become stronger and more frequent with time.  Contractions that start on the top of the uterus and spread down to the lower abdomen and back.  Increased pelvic pressure and dull back pain.  A watery or bloody mucus discharge that comes from the vagina.  Leaking of amniotic fluid. This is also known as your "water breaking." It could be a slow trickle or a gush. Let your health care provider know if it has a color or strange odor. If you have any of these signs, call your health care provider right away, even if it is before your due date. Follow these instructions at home: Medicines  Follow your health care provider's instructions regarding medicine use. Specific medicines may be either safe or unsafe to take during pregnancy.  Take a prenatal vitamin that contains at least 600 micrograms (mcg) of folic acid.  If you develop constipation, try taking a stool softener if your health care provider approves. Eating and drinking  Eat a balanced diet that includes fresh fruits and vegetables, whole grains, good sources of protein  such as meat, eggs, or tofu, and low-fat dairy. Your health care provider will help you determine the amount of weight gain that is right for you.  Avoid raw meat and uncooked cheese. These carry germs that can cause birth defects in the baby.  If you have low calcium intake from food, talk to your health care provider about whether you should take a daily calcium supplement.  Eat four or five small meals rather than three large meals a day.  Limit foods that are high in fat and processed sugars, such as fried and sweet foods.  To prevent constipation: ? Drink enough fluid to keep your urine clear or pale yellow. ? Eat foods that are high in fiber, such as fresh fruits and vegetables, whole grains, and beans. Activity  Exercise only as directed by your health care provider. Most women can continue their usual exercise routine during pregnancy. Try to exercise for 30 minutes at least 5 days a week. Stop exercising if you experience uterine contractions.  Avoid heavy   lifting.  Do not exercise in extreme heat or humidity, or at high altitudes.  Wear low-heel, comfortable shoes.  Practice good posture.  You may continue to have sex unless your health care provider tells you otherwise. Relieving pain and discomfort  Take frequent breaks and rest with your legs elevated if you have leg cramps or low back pain.  Take warm sitz baths to soothe any pain or discomfort caused by hemorrhoids. Use hemorrhoid cream if your health care provider approves.  Wear a good support bra to prevent discomfort from breast tenderness.  If you develop varicose veins: ? Wear support pantyhose or compression stockings as told by your healthcare provider. ? Elevate your feet for 15 minutes, 3-4 times a day. Prenatal care  Write down your questions. Take them to your prenatal visits.  Keep all your prenatal visits as told by your health care provider. This is important. Safety  Wear your seat belt at  all times when driving.  Make a list of emergency phone numbers, including numbers for family, friends, the hospital, and police and fire departments. General instructions  Avoid cat litter boxes and soil used by cats. These carry germs that can cause birth defects in the baby. If you have a cat, ask someone to clean the litter box for you.  Do not travel far distances unless it is absolutely necessary and only with the approval of your health care provider.  Do not use hot tubs, steam rooms, or saunas.  Do not drink alcohol.  Do not use any products that contain nicotine or tobacco, such as cigarettes and e-cigarettes. If you need help quitting, ask your health care provider.  Do not use any medicinal herbs or unprescribed drugs. These chemicals affect the formation and growth of the baby.  Do not douche or use tampons or scented sanitary pads.  Do not cross your legs for long periods of time.  To prepare for the arrival of your baby: ? Take prenatal classes to understand, practice, and ask questions about labor and delivery. ? Make a trial run to the hospital. ? Visit the hospital and tour the maternity area. ? Arrange for maternity or paternity leave through employers. ? Arrange for family and friends to take care of pets while you are in the hospital. ? Purchase a rear-facing car seat and make sure you know how to install it in your car. ? Pack your hospital bag. ? Prepare the baby's nursery. Make sure to remove all pillows and stuffed animals from the baby's crib to prevent suffocation.  Visit your dentist if you have not gone during your pregnancy. Use a soft toothbrush to brush your teeth and be gentle when you floss. Contact a health care provider if:  You are unsure if you are in labor or if your water has broken.  You become dizzy.  You have mild pelvic cramps, pelvic pressure, or nagging pain in your abdominal area.  You have lower back pain.  You have persistent  nausea, vomiting, or diarrhea.  You have an unusual or bad smelling vaginal discharge.  You have pain when you urinate. Get help right away if:  Your water breaks before 37 weeks.  You have regular contractions less than 5 minutes apart before 37 weeks.  You have a fever.  You are leaking fluid from your vagina.  You have spotting or bleeding from your vagina.  You have severe abdominal pain or cramping.  You have rapid weight loss or weight gain.    You have shortness of breath with chest pain.  You notice sudden or extreme swelling of your face, hands, ankles, feet, or legs.  Your baby makes fewer than 10 movements in 2 hours.  You have severe headaches that do not go away when you take medicine.  You have vision changes. Summary  The third trimester is from week 28 through week 40, months 7 through 9. The third trimester is a time when the unborn baby (fetus) is growing rapidly.  During the third trimester, your discomfort may increase as you and your baby continue to gain weight. You may have abdominal, leg, and back pain, sleeping problems, and an increased need to urinate.  During the third trimester your breasts will keep growing and they will continue to become tender. A yellow fluid (colostrum) may leak from your breasts. This is the first milk you are producing for your baby.  False labor is a condition in which you feel small, irregular tightenings of the muscles in the womb (contractions) that eventually go away. These are called Braxton Hicks contractions. Contractions may last for hours, days, or even weeks before true labor sets in.  Signs of labor can include: abdominal cramps; regular contractions that start at 10 minutes apart and become stronger and more frequent with time; watery or bloody mucus discharge that comes from the vagina; increased pelvic pressure and dull back pain; and leaking of amniotic fluid. This information is not intended to replace advice  given to you by your health care provider. Make sure you discuss any questions you have with your health care provider. Document Released: 12/23/2000 Document Revised: 06/06/2015 Document Reviewed: 03/01/2012 Elsevier Interactive Patient Education  2017 [employer]vier Inc.  

## 2016-11-23 ENCOUNTER — Ambulatory Visit (INDEPENDENT_AMBULATORY_CARE_PROVIDER_SITE_OTHER): Payer: Self-pay | Admitting: Obstetrics & Gynecology

## 2016-11-23 VITALS — BP 114/63 | HR 70 | Wt 243.5 lb

## 2016-11-23 DIAGNOSIS — Z23 Encounter for immunization: Secondary | ICD-10-CM

## 2016-11-23 DIAGNOSIS — O0992 Supervision of high risk pregnancy, unspecified, second trimester: Secondary | ICD-10-CM

## 2016-11-23 DIAGNOSIS — O09529 Supervision of elderly multigravida, unspecified trimester: Secondary | ICD-10-CM

## 2016-11-23 DIAGNOSIS — Z8751 Personal history of pre-term labor: Secondary | ICD-10-CM

## 2016-11-23 DIAGNOSIS — O09522 Supervision of elderly multigravida, second trimester: Secondary | ICD-10-CM

## 2016-11-23 DIAGNOSIS — O0990 Supervision of high risk pregnancy, unspecified, unspecified trimester: Secondary | ICD-10-CM

## 2016-11-23 DIAGNOSIS — Z8759 Personal history of other complications of pregnancy, childbirth and the puerperium: Secondary | ICD-10-CM

## 2016-11-23 MED ORDER — TETANUS-DIPHTH-ACELL PERTUSSIS 5-2.5-18.5 LF-MCG/0.5 IM SUSP
0.5000 mL | Freq: Once | INTRAMUSCULAR | Status: AC
  Administered 2016-11-23: 0.5 mL via INTRAMUSCULAR

## 2016-11-23 NOTE — Patient Instructions (Signed)

## 2016-11-23 NOTE — Progress Notes (Signed)
   PRENATAL VISIT NOTE  Subjective:  Rebecca Ruiz is a 41 y.o. [id number] at 1975w5d being seen today for ongoing prenatal care.  She is currently monitored for the following issues for this low-risk pregnancy and has History of pre-eclampsia; Advanced maternal age in multigravida; History of preterm delivery; Grand multiparity; Supervision of high risk pregnancy, antepartum; and History of gestational diabetes on their problem list.  Patient reports no complaints.  Contractions: Not present. Vag. Bleeding: None.  Movement: Present. Denies leaking of fluid.   The following portions of the patient's history were reviewed and updated as appropriate: allergies, current medications, past family history, past medical history, past social history, past surgical history and problem list. Problem list updated.  Objective:   Vitals:   11/23/16 1050  BP: 114/63  Pulse: 70  Weight: 110.5 kg (243 lb 8 oz)    Fetal Status: Fetal Heart Rate (bpm): 134   Movement: Present     General:  Alert, oriented and cooperative. Patient is in no acute distress.  Skin: Skin is warm and dry. No rash noted.   Cardiovascular: Normal heart rate noted  Respiratory: Normal respiratory effort, no problems with respiration noted  Abdomen: Soft, gravid, appropriate for gestational age.  Pain/Pressure: Absent     Pelvic: Cervical exam deferred        Extremities: Normal range of motion.  Edema: None  Mental Status:  Normal mood and affect. Normal behavior. Normal judgment and thought content.   Assessment and Plan:  Pregnancy: [id number] at 3975w5d  1. Supervision of high risk pregnancy, antepartum Routine third trimester testing - Glucose Tolerance, 2 Hours w/1 Hour - CBC - RPR - HIV antibody - Tdap (BOOSTRIX) injection 0.5 mL  2. History of preterm delivery  - Glucose Tolerance, 2 Hours w/1 Hour - CBC - RPR - HIV antibody - Tdap (BOOSTRIX) injection 0.5 mL  3. Antepartum multigravida of advanced  maternal age  - Glucose Tolerance, 2 Hours w/1 Hour - CBC - RPR - HIV antibody - Tdap (BOOSTRIX) injection 0.5 mL  4. History of pre-eclampsia  - Glucose Tolerance, 2 Hours w/1 Hour - CBC - RPR - HIV antibody - Tdap (BOOSTRIX) injection 0.5 mL  Preterm labor symptoms and general obstetric precautions including but not limited to vaginal bleeding, contractions, leaking of fluid and fetal movement were reviewed in detail with the patient. Please refer to After Visit Summary for other counseling recommendations.  Return in about 2 weeks (around 12/07/2016).   Scheryl DarterJames Toan Mort, MD

## 2016-11-24 LAB — CBC
Hematocrit: 35.5 % (ref 34.0–46.6)
Hemoglobin: 11.6 g/dL (ref 11.1–15.9)
MCH: 28.5 pg (ref 26.6–33.0)
MCHC: 32.7 g/dL (ref 31.5–35.7)
MCV: 87 fL (ref 79–97)
PLATELETS: 235 10*3/uL (ref 150–379)
RBC: 4.07 x10E6/uL (ref 3.77–5.28)
RDW: 15.6 % — AB (ref 12.3–15.4)
WBC: 8.9 10*3/uL (ref 3.4–10.8)

## 2016-11-24 LAB — GLUCOSE TOLERANCE, 2 HOURS W/ 1HR
GLUCOSE, 1 HOUR: 210 mg/dL — AB (ref 65–179)
Glucose, 2 hour: 166 mg/dL — ABNORMAL HIGH (ref 65–152)
Glucose, Fasting: 99 mg/dL — ABNORMAL HIGH (ref 65–91)

## 2016-11-24 LAB — HIV ANTIBODY (ROUTINE TESTING W REFLEX): HIV Screen 4th Generation wRfx: NONREACTIVE

## 2016-11-24 LAB — RPR: RPR: NONREACTIVE

## 2016-12-07 ENCOUNTER — Telehealth: Payer: Self-pay | Admitting: General Practice

## 2016-12-07 NOTE — Telephone Encounter (Signed)
-----   Message from Adam PhenixJames G Arnold, MD sent at 11/30/2016  4:10 PM EST ----- Dx diabetes, needs to be seen by diabetes educator

## 2016-12-07 NOTE — Telephone Encounter (Signed)
Per chart review, patient also doesn't have OB follow up scheduled. Called patient with pacific interpreter [phone], no answer- left message stating we are trying to reach you with results, please call us back.

## 2016-12-11 ENCOUNTER — Ambulatory Visit (HOSPITAL_COMMUNITY)
Admission: RE | Admit: 2016-12-11 | Discharge: 2016-12-11 | Disposition: A | Discharge status: Home / Self Care | Payer: Self-pay | Source: Ambulatory Visit | Attending: Obstetrics and Gynecology | Admitting: Obstetrics and Gynecology

## 2016-12-23 ENCOUNTER — Encounter: Payer: Self-pay | Admitting: Obstetrics and Gynecology

## 2016-12-23 ENCOUNTER — Ambulatory Visit (INDEPENDENT_AMBULATORY_CARE_PROVIDER_SITE_OTHER): Payer: Self-pay | Admitting: Obstetrics and Gynecology

## 2016-12-23 VITALS — BP 108/77 | HR 90 | Wt 242.2 lb

## 2016-12-23 DIAGNOSIS — Z8751 Personal history of pre-term labor: Secondary | ICD-10-CM

## 2016-12-23 DIAGNOSIS — O09213 Supervision of pregnancy with history of pre-term labor, third trimester: Secondary | ICD-10-CM

## 2016-12-23 DIAGNOSIS — Z8759 Personal history of other complications of pregnancy, childbirth and the puerperium: Secondary | ICD-10-CM

## 2016-12-23 DIAGNOSIS — O09523 Supervision of elderly multigravida, third trimester: Secondary | ICD-10-CM

## 2016-12-23 DIAGNOSIS — O0990 Supervision of high risk pregnancy, unspecified, unspecified trimester: Secondary | ICD-10-CM

## 2016-12-23 DIAGNOSIS — O09529 Supervision of elderly multigravida, unspecified trimester: Secondary | ICD-10-CM

## 2016-12-23 DIAGNOSIS — O0993 Supervision of high risk pregnancy, unspecified, third trimester: Secondary | ICD-10-CM

## 2016-12-23 DIAGNOSIS — O24419 Gestational diabetes mellitus in pregnancy, unspecified control: Secondary | ICD-10-CM | POA: Insufficient documentation

## 2016-12-23 NOTE — Progress Notes (Signed)
   PRENATAL VISIT NOTE  Subjective:  Rebecca Ruiz is a 41 y.o. [id number] at 1179w0d being seen today for ongoing prenatal care.  She is currently monitored for the following issues for this high-risk pregnancy and has History of pre-eclampsia; Advanced maternal age in multigravida; History of preterm delivery; Grand multiparity; Supervision of high risk pregnancy, antepartum; History of gestational diabetes; and Gestational diabetes mellitus (GDM) on their problem list.  Patient reports no complaints.  Contractions: Irregular. Vag. Bleeding: None.  Movement: Present. Denies leaking of fluid. Some sciatic nerve pain.  The following portions of the patient's history were reviewed and updated as appropriate: allergies, current medications, past family history, past medical history, past social history, past surgical history and problem list. Problem list updated.  Objective:   Vitals:   12/23/16 1600  BP: 108/77  Pulse: 90  Weight: 242 lb 3.2 oz (109.9 kg)    Fetal Status: Fetal Heart Rate (bpm): 142   Movement: Present     General:  Alert, oriented and cooperative. Patient is in no acute distress.  Skin: Skin is warm and dry. No rash noted.   Cardiovascular: Normal heart rate noted  Respiratory: Normal respiratory effort, no problems with respiration noted  Abdomen: Soft, gravid, appropriate for gestational age.  Pain/Pressure: Present     Pelvic: Cervical exam deferred        Extremities: Normal range of motion.  Edema: None  Mental Status:  Normal mood and affect. Normal behavior. Normal judgment and thought content.   Assessment and Plan:  Pregnancy: [id number] at 2679w0d  1. History of preterm delivery Not on progesterone  2. Supervision of high risk pregnancy, antepartum   3. Antepartum multigravida of advanced maternal age To start testing 36 weeks Has US follow up 12/28/16  4. History of pre-eclampsia cont baby ASA  5. Gestational diabetes mellitus (GDM) in third  trimester, gestational diabetes method of control unspecified Abnormal 2 hr, states she has been returning calls but has not been able to speak with anyone Was diet controlled in prior pregnancy, she will start diet again Has not seen diabetic educator yet, scheduled for next Tuesday F/u growth US scheduled for 12/28/16  Preterm labor symptoms and general obstetric precautions including but not limited to vaginal bleeding, contractions, leaking of fluid and fetal movement were reviewed in detail with the patient. Please refer to After Visit Summary for other counseling recommendations.  Return in about 2 weeks (around 01/06/2017) for OB visit (MD).   Conan BowensKelly M Obera Stauch, MD

## 2016-12-23 NOTE — Progress Notes (Signed)
C/o irregular occasional contractions.

## 2016-12-28 ENCOUNTER — Other Ambulatory Visit (HOSPITAL_COMMUNITY): Payer: Self-pay | Admitting: *Deleted

## 2016-12-28 ENCOUNTER — Ambulatory Visit (HOSPITAL_COMMUNITY)
Admission: RE | Admit: 2016-12-28 | Discharge: 2016-12-28 | Disposition: A | Discharge status: Home / Self Care | Payer: Self-pay | Source: Ambulatory Visit | Attending: Obstetrics and Gynecology | Admitting: Obstetrics and Gynecology

## 2016-12-28 ENCOUNTER — Encounter (HOSPITAL_COMMUNITY): Payer: Self-pay

## 2016-12-28 DIAGNOSIS — O99213 Obesity complicating pregnancy, third trimester: Secondary | ICD-10-CM | POA: Insufficient documentation

## 2016-12-28 DIAGNOSIS — Z6841 Body Mass Index (BMI) 40.0 and over, adult: Secondary | ICD-10-CM | POA: Insufficient documentation

## 2016-12-28 DIAGNOSIS — O09523 Supervision of elderly multigravida, third trimester: Secondary | ICD-10-CM | POA: Insufficient documentation

## 2016-12-28 DIAGNOSIS — O09299 Supervision of pregnancy with other poor reproductive or obstetric history, unspecified trimester: Secondary | ICD-10-CM | POA: Insufficient documentation

## 2016-12-28 DIAGNOSIS — Z3A31 31 weeks gestation of pregnancy: Secondary | ICD-10-CM | POA: Insufficient documentation

## 2016-12-28 DIAGNOSIS — O24419 Gestational diabetes mellitus in pregnancy, unspecified control: Secondary | ICD-10-CM | POA: Insufficient documentation

## 2016-12-28 DIAGNOSIS — Z362 Encounter for other antenatal screening follow-up: Secondary | ICD-10-CM | POA: Insufficient documentation

## 2016-12-29 ENCOUNTER — Ambulatory Visit: Payer: Medicaid Other | Admitting: *Deleted

## 2016-12-29 ENCOUNTER — Encounter: Payer: Self-pay | Attending: Obstetrics & Gynecology | Admitting: *Deleted

## 2016-12-29 DIAGNOSIS — R7302 Impaired glucose tolerance (oral): Secondary | ICD-10-CM | POA: Insufficient documentation

## 2016-12-29 DIAGNOSIS — R7309 Other abnormal glucose: Secondary | ICD-10-CM

## 2016-12-29 DIAGNOSIS — Z713 Dietary counseling and surveillance: Secondary | ICD-10-CM | POA: Insufficient documentation

## 2016-12-29 MED ORDER — ACCU-CHEK GUIDE W/DEVICE KIT: 1.0000 | PACK | Freq: Once | 0 refills | Status: AC

## 2016-12-29 MED ORDER — GLUCOSE BLOOD VI STRP: ORAL_STRIP | 12 refills | Status: DC

## 2016-12-29 MED ORDER — ACCU-CHEK FASTCLIX LANCETS MISC: 1.0000 | Freq: Four times a day (QID) | 12 refills | Status: DC

## 2016-12-29 NOTE — Progress Notes (Signed)
  Patient was seen on 12/29/2016 for Gestational Diabetes self-management . We started with [employer] Spanish interpretor until live interpretor arrived at 11:15.  Patient states history of GDM with last pregnancy one year ago. She did not test her BG at that time. She is concerned that her Medicaid may not be active, but it looks like it is, so I will have the Rx called into her pharmacy at [facility]. IF it is not covered, she will call here to schedule appt. with me in 2 days so I can provide her a meter if she has no coverage. Diet history  Shows she eats 2-3 meals a day and occasionally drinks regular soda. The following learning objectives were met by the patient :   States the definition of Gestational Diabetes  States why dietary management is important in controlling blood glucose  Describes the effects of carbohydrates on blood glucose levels  Demonstrates ability to create a balanced meal plan  Demonstrates carbohydrate counting   States when to check blood glucose levels  Demonstrates proper blood glucose monitoring techniques  States the effect of stress and exercise on blood glucose levels  States the importance of limiting caffeine and abstaining from alcohol and smoking  Plan:  Aim for 3 Carb Choices per meal (45 grams) +/- 1 either way  Aim for 1-2 Carbs per snack Begin reading food labels for Total Carbohydrate of foods Consider  increasing your activity level by walking or other activity daily as tolerated Begin checking BG before breakfast and 2 hours after first bite of breakfast, lunch and dinner as directed by MD  Bring Log Book to every medical appointment   Take medication if directed by MD  Blood glucose monitor Rx called into pharmacy: Accu Check Guide with Fast Clix drums Patient instructed to test pre breakfast and 2 hours each meal as directed by MD  Patient instructed to monitor glucose levels:  FBS: 60 - 95 mg/dl 2 hour: <120 mg/dl  Patient received  the following handouts: in Spanish  Nutrition Diabetes and Pregnancy  Carbohydrate Counting List  Patient will be seen for follow-up as needed.

## 2016-12-29 NOTE — Addendum Note (Signed)
Addended by: Kathee DeltonHILLMAN, Jaquann Guarisco L on: 12/29/2016 03:03 PM   Modules accepted: Orders

## 2016-12-30 NOTE — Telephone Encounter (Signed)
Patient has f/u scheduled and will see the diabetes educator 12/20.

## 2016-12-31 ENCOUNTER — Encounter: Payer: Self-pay | Admitting: *Deleted

## 2016-12-31 ENCOUNTER — Ambulatory Visit: Payer: Self-pay | Admitting: *Deleted

## 2016-12-31 DIAGNOSIS — R7309 Other abnormal glucose: Secondary | ICD-10-CM

## 2016-12-31 NOTE — Progress Notes (Signed)
  Patient was seen on 12/31/2016 for Gestational Diabetes self-management follow up visit. Spanish interpretor here today.  Patient states when she went to get her Accu Chek Meter from pharmacy, it was not covered under her type of Medicaid. So she is back today to get a meter from me as self pay patient along with instruction on same.    States when to check blood glucose levels  Demonstrates proper blood glucose monitoring techniques  Plan:  Blood glucose monitor given: [employer] Lot # [id number] Exp: 05/02/2018 Blood glucose reading: 138 mg/dl @ 1.611.25 hours after lunch  Patient instructed to monitor glucose levels:  FBS: 60 - 95 mg/dl 2 hour: <096<120 mg/dl  Patient will be seen for follow-up as needed.

## 2017-01-08 ENCOUNTER — Encounter: Payer: Self-pay | Admitting: Obstetrics & Gynecology

## 2017-01-12 NOTE — L&D Delivery Note (Signed)
Patient: Rebecca Ruiz MRN: [id number]  GBS status: negative  Patient is a 42 y.o. now [id number] s/p NSVD at 2026w1d, who was admitted for IOl for A2GDM. Course also complicated by pre-eclampsia w/o severe features. S/p IOL with cytotec and Pitocin. ROM at time of delivery.   Delivery Note At 2:43 AM a viable female was delivered via Vaginal, Spontaneous (Presentation: ;  ).  APGAR: 9, 9; weight 9 lb 0.5 oz (4095 g).   Placenta status: intact.  Cord:  3-vessel  Patient found to be complete and intact at 02:30 am. At 2:43 am, called to the room for delivery. Upon arrival baby just delivered en caul, then immediately ruptured. Infant with spontaneous cry, placed on mother's abdomen, dried and bulb suctioned. Cord clamped x 2 after 1-minute delay, and cut by family member. Cord blood drawn. Placenta delivered spontaneously with gentle cord traction. Fundus firm with massage and Pitocin. Perineum inspected and found to have no lacerations.  Anesthesia:  Epidural Episiotomy: None Lacerations: None Suture Repair: none Est. Blood Loss (mL): 50  Mom to postpartum.  Baby to Couplet care / Skin to Skin.  ADDENDUM: Called back to the room about 90 minutes later d/t persistent bleeding with fundal checks. On inspection, uterus was cleared of clots and small pieces of membranes. Then bimanual massage and methergine given. Fundus still at the umbilicus although firm, still felt like some trailing membranes adhered to the uterus vs decidual tissue. Dr. Emelda FearFerguson called to the room, who also explored and attempted bedside curettage, without much yield. No additional bleeding on fundal massage. Pt left in stable condition. Will given Ancef 2 g IV once for prophylaxis.   [name], MD OB Fellow 02/18/17, 4:44 AM

## 2017-01-14 ENCOUNTER — Encounter: Payer: Self-pay | Attending: Obstetrics & Gynecology | Admitting: *Deleted

## 2017-01-14 ENCOUNTER — Other Ambulatory Visit: Payer: Self-pay | Admitting: Obstetrics and Gynecology

## 2017-01-14 ENCOUNTER — Ambulatory Visit: Payer: Self-pay | Admitting: *Deleted

## 2017-01-14 DIAGNOSIS — R7302 Impaired glucose tolerance (oral): Secondary | ICD-10-CM | POA: Insufficient documentation

## 2017-01-14 DIAGNOSIS — Z713 Dietary counseling and surveillance: Secondary | ICD-10-CM | POA: Insufficient documentation

## 2017-01-14 DIAGNOSIS — R7309 Other abnormal glucose: Secondary | ICD-10-CM

## 2017-01-14 MED ORDER — GLYBURIDE 5 MG PO TABS: ORAL_TABLET | ORAL | 3 refills | Status: DC

## 2017-01-14 NOTE — Progress Notes (Signed)
  Patient was seen on 01/14/2017 for Gestational Diabetes self-management follow up visit. Spanish interpretor here today.  Patient brought BG Log sheet for past 2 weeks to appointment as directed. Almost all fasting and 2 hour post meal BG are elevated. I spoke to Dr. Mitzi Hansen. Pickens and he Rx'd 5 mg Glyburide before breakfast and supper. Patient did not make her last appointment with MD for 01/08/2017 so he directed that an MD appointment be made ASAP.  I reviewed the BG results with the patient as well as target ranges. Diet history reveals she is limiting carb containing foods to 1-2 servings per meal. She also states she has been drinking some fruit juices which I explained she needs to stop. I explained the action of the Glyburide is to increase her body's insulin production and to take it before breakfast and supper. I also explained that if BG are still too high with the medication, then we have insulin as the next option and that if that is needed, I can teach her how to take it.   Plan: FBS: 60 - 95 mg/dl 2 hour: <161<120 mg/dl Take Glyburide before breakfast and supper meals Stop drinking fruit juices  See MD next week and bring Log Sheet to that appointment   Patient will be seen for follow-up as needed.

## 2017-01-21 ENCOUNTER — Encounter: Payer: Self-pay | Admitting: Obstetrics and Gynecology

## 2017-01-21 ENCOUNTER — Ambulatory Visit (INDEPENDENT_AMBULATORY_CARE_PROVIDER_SITE_OTHER): Payer: Medicaid Other | Admitting: Obstetrics and Gynecology

## 2017-01-21 VITALS — BP 112/83 | HR 67 | Wt 243.0 lb

## 2017-01-21 DIAGNOSIS — O24419 Gestational diabetes mellitus in pregnancy, unspecified control: Secondary | ICD-10-CM

## 2017-01-21 DIAGNOSIS — Z789 Other specified health status: Secondary | ICD-10-CM | POA: Insufficient documentation

## 2017-01-21 DIAGNOSIS — O0993 Supervision of high risk pregnancy, unspecified, third trimester: Secondary | ICD-10-CM | POA: Diagnosis present

## 2017-01-21 DIAGNOSIS — Z113 Encounter for screening for infections with a predominantly sexual mode of transmission: Secondary | ICD-10-CM

## 2017-01-21 LAB — OB RESULTS CONSOLE GBS: STREP GROUP B AG: NEGATIVE

## 2017-01-21 LAB — OB RESULTS CONSOLE GC/CHLAMYDIA: GC PROBE AMP, GENITAL: NEGATIVE

## 2017-01-21 MED ORDER — GLYBURIDE 5 MG PO TABS: ORAL_TABLET | ORAL | 3 refills | Status: DC

## 2017-01-21 NOTE — Progress Notes (Signed)
   Prenatal Visit Note Date: 01/21/2017 Clinic: [facility]  Subjective:  Rondel OhJosefina Tinoco Gutierrez is a 42 y.o. [id number] at 5815w1d being seen today for ongoing prenatal care.  She is currently monitored for the following issues for this high-risk pregnancy and has History of pre-eclampsia; Advanced maternal age in multigravida; History of preterm delivery; Grand multiparity; Supervision of high risk pregnancy, antepartum; History of gestational diabetes; GDM, class A2; and Language barrier on their problem list.  Patient reports no complaints.   Contractions: Not present. Vag. Bleeding: None.  Movement: Present. Denies leaking of fluid.   The following portions of the patient's history were reviewed and updated as appropriate: allergies, current medications, past family history, past medical history, past social history, past surgical history and problem list. Problem list updated.  Objective:   Vitals:   01/21/17 1506  BP: 112/83  Pulse: 67  Weight: 243 lb (110.2 kg)    Fetal Status: Fetal Heart Rate (bpm): 140   Movement: Present     General:  Alert, oriented and cooperative. Patient is in no acute distress.  Skin: Skin is warm and dry. No rash noted.   Cardiovascular: Normal heart rate noted  Respiratory: Normal respiratory effort, no problems with respiration noted  Abdomen: Soft, gravid, appropriate for gestational age. Pain/Pressure: Present     Pelvic:  Cervical exam deferred        Extremities: Normal range of motion.  Edema: None  Mental Status: Normal mood and affect. Normal behavior. Normal judgment and thought content.   Urinalysis:      Assessment and Plan:  Pregnancy: [id number] at 5415w1d  1. Supervision of high risk pregnancy in third trimester Routine care. IUD - Cervicovaginal ancillary only - Strep Gp B Culture+Rflx  2. Language barrier Interpreter used  3. Gestational diabetes mellitus (GDM) in third trimester, gestational diabetes  method of control unspecified Has missed multiple visits with last ROB visit back on 12/23/2016. Patient saw DM education last week and had elevated blood sugars and I was asked to review her log and I recommend glyburide 5 with breakfast and dinner and to make an asap OB visit with testing. Has growth and bpp for 1/14 and will do an nst today.  Am fastings in the 110s-120s  And 2hr pp >25% above 120s in the 130s-150s. Recommend continuing 5 with breakfast and 7.5 with dinner as her dinner values tend to be worse and to hopefully help with her AM fastings. Continue with weekly testing and delivery at 39wks  4. H/o PTB Patient not taking anything currently  Preterm labor symptoms and general obstetric precautions including but not limited to vaginal bleeding, contractions, leaking of fluid and fetal movement were reviewed in detail with the patient. Please refer to After Visit Summary for other counseling recommendations.  Return in about 4 days (around 01/25/2017) for hrob and nst - has US @ 1030. needs to start weekly nst/bpp on 1/21 with hrob visit. [place].   Gaylon Melchor, MD

## 2017-01-22 LAB — CERVICOVAGINAL ANCILLARY ONLY
Chlamydia: NEGATIVE
NEISSERIA GONORRHEA: NEGATIVE

## 2017-01-25 ENCOUNTER — Other Ambulatory Visit (HOSPITAL_COMMUNITY): Payer: Self-pay | Admitting: *Deleted

## 2017-01-25 ENCOUNTER — Encounter (HOSPITAL_COMMUNITY): Payer: Self-pay

## 2017-01-25 ENCOUNTER — Ambulatory Visit (HOSPITAL_COMMUNITY)
Admission: RE | Admit: 2017-01-25 | Discharge: 2017-01-25 | Disposition: A | Discharge status: Home / Self Care | Payer: Medicaid Other | Source: Ambulatory Visit | Attending: Obstetrics and Gynecology | Admitting: Obstetrics and Gynecology

## 2017-01-25 ENCOUNTER — Ambulatory Visit (INDEPENDENT_AMBULATORY_CARE_PROVIDER_SITE_OTHER): Payer: Medicaid Other | Admitting: *Deleted

## 2017-01-25 VITALS — BP 112/77 | HR 76

## 2017-01-25 DIAGNOSIS — Z8759 Personal history of other complications of pregnancy, childbirth and the puerperium: Secondary | ICD-10-CM

## 2017-01-25 DIAGNOSIS — O09893 Supervision of other high risk pregnancies, third trimester: Secondary | ICD-10-CM | POA: Insufficient documentation

## 2017-01-25 DIAGNOSIS — O09293 Supervision of pregnancy with other poor reproductive or obstetric history, third trimester: Secondary | ICD-10-CM | POA: Insufficient documentation

## 2017-01-25 DIAGNOSIS — Z641 Problems related to multiparity: Secondary | ICD-10-CM

## 2017-01-25 DIAGNOSIS — O0990 Supervision of high risk pregnancy, unspecified, unspecified trimester: Secondary | ICD-10-CM

## 2017-01-25 DIAGNOSIS — Z3A35 35 weeks gestation of pregnancy: Secondary | ICD-10-CM | POA: Insufficient documentation

## 2017-01-25 DIAGNOSIS — Z8751 Personal history of pre-term labor: Secondary | ICD-10-CM

## 2017-01-25 DIAGNOSIS — O24419 Gestational diabetes mellitus in pregnancy, unspecified control: Secondary | ICD-10-CM | POA: Diagnosis present

## 2017-01-25 DIAGNOSIS — O24410 Gestational diabetes mellitus in pregnancy, diet controlled: Secondary | ICD-10-CM | POA: Insufficient documentation

## 2017-01-25 DIAGNOSIS — O09213 Supervision of pregnancy with history of pre-term labor, third trimester: Secondary | ICD-10-CM | POA: Insufficient documentation

## 2017-01-25 DIAGNOSIS — O09523 Supervision of elderly multigravida, third trimester: Secondary | ICD-10-CM

## 2017-01-25 DIAGNOSIS — Z8632 Personal history of gestational diabetes: Secondary | ICD-10-CM

## 2017-01-25 DIAGNOSIS — O24415 Gestational diabetes mellitus in pregnancy, controlled by oral hypoglycemic drugs: Secondary | ICD-10-CM

## 2017-01-25 NOTE — Progress Notes (Signed)
Interpreter Maretta LosBlanca Lindner present for encounter. US for growth and BPP today @ 1030.  Blood sugar values reviewed by Dr. Debroah LoopArnold.  Pt instructed to continue current dose of Glyburide. Pt has no questions or problems. HOB appt for tomorrow cancelled.

## 2017-01-26 ENCOUNTER — Encounter: Payer: Self-pay | Admitting: Family Medicine

## 2017-01-26 LAB — STREP GP B CULTURE+RFLX: Strep Gp B Culture+Rflx: NEGATIVE

## 2017-02-01 ENCOUNTER — Ambulatory Visit: Payer: Self-pay

## 2017-02-01 ENCOUNTER — Ambulatory Visit (INDEPENDENT_AMBULATORY_CARE_PROVIDER_SITE_OTHER): Payer: Medicaid Other | Admitting: *Deleted

## 2017-02-01 ENCOUNTER — Ambulatory Visit (INDEPENDENT_AMBULATORY_CARE_PROVIDER_SITE_OTHER): Payer: Medicaid Other | Admitting: Family Medicine

## 2017-02-01 VITALS — BP 113/87 | HR 93 | Wt 247.0 lb

## 2017-02-01 DIAGNOSIS — O09522 Supervision of elderly multigravida, second trimester: Secondary | ICD-10-CM

## 2017-02-01 DIAGNOSIS — O24419 Gestational diabetes mellitus in pregnancy, unspecified control: Secondary | ICD-10-CM

## 2017-02-01 DIAGNOSIS — O26893 Other specified pregnancy related conditions, third trimester: Secondary | ICD-10-CM

## 2017-02-01 DIAGNOSIS — O09523 Supervision of elderly multigravida, third trimester: Secondary | ICD-10-CM

## 2017-02-01 DIAGNOSIS — O0993 Supervision of high risk pregnancy, unspecified, third trimester: Secondary | ICD-10-CM

## 2017-02-01 DIAGNOSIS — R7309 Other abnormal glucose: Secondary | ICD-10-CM

## 2017-02-01 DIAGNOSIS — Z789 Other specified health status: Secondary | ICD-10-CM

## 2017-02-01 DIAGNOSIS — R7302 Impaired glucose tolerance (oral): Secondary | ICD-10-CM

## 2017-02-01 DIAGNOSIS — R12 Heartburn: Secondary | ICD-10-CM

## 2017-02-01 MED ORDER — GLUCOSE BLOOD VI STRP: ORAL_STRIP | 12 refills | Status: AC

## 2017-02-01 MED ORDER — ACCU-CHEK FASTCLIX LANCETS MISC: 1.0000 | Freq: Four times a day (QID) | 12 refills | Status: AC

## 2017-02-01 MED ORDER — FAMOTIDINE 20 MG PO TABS: 20.0000 mg | ORAL_TABLET | Freq: Two times a day (BID) | ORAL | 3 refills | Status: AC

## 2017-02-01 NOTE — Progress Notes (Signed)

## 2017-02-01 NOTE — Progress Notes (Signed)
Subjective:  Rebecca Ruiz is a 42 y.o. [phone] at 1746w5d being seen today for ongoing prenatal care.  She is currently monitored for the following issues for this high-risk pregnancy and has History of pre-eclampsia; Advanced maternal age in multigravida; History of preterm delivery; Grand multiparity; Supervision of high risk pregnancy, antepartum; History of gestational diabetes; GDM, class A2; and Language barrier on their problem list.  GDM: Patient taking glyburide 5mg  in AM and 7.5mg  in PM.  Reports no hypoglycemic episodes.  Tolerating medication well Fasting: 70-104 2hr PP: 74-130 (also had one day of elevated CBGs: 170 and 207)  Patient reports headache.  Contractions: Irregular. Vag. Bleeding: None.  Movement: Present. Denies leaking of fluid.   The following portions of the patient's history were reviewed and updated as appropriate: allergies, current medications, past family history, past medical history, past social history, past surgical history and problem list. Problem list updated.  Objective:   Vitals:   02/01/17 1321  BP: 113/87  Pulse: 93  Weight: 247 lb (112 kg)    Fetal Status: Fetal Heart Rate (bpm): NST   Movement: Present     General:  Alert, oriented and cooperative. Patient is in no acute distress.  Skin: Skin is warm and dry. No rash noted.   Cardiovascular: Normal heart rate noted  Respiratory: Normal respiratory effort, no problems with respiration noted  Abdomen: Soft, gravid, appropriate for gestational age. Pain/Pressure: Present     Pelvic: Vag. Bleeding: None     Cervical exam deferred        Extremities: Normal range of motion.  Edema: None  Mental Status: Normal mood and affect. Normal behavior. Normal judgment and thought content.   Urinalysis:      Assessment and Plan:  Pregnancy: [id number] at 1546w5d  1. Supervision of high risk pregnancy in third trimester FHT normal  2. Gestational diabetes mellitus (GDM) in third trimester,  gestational diabetes method of control unspecified Increase protein in diet Less refined carbs BPP 10/10  3. Language barrier Interpreter used  4. Elderly multigravida in second trimester  5. Heartburn during pregnancy in third trimester pepcid prescirbed   Preterm labor symptoms and general obstetric precautions including but not limited to vaginal bleeding, contractions, leaking of fluid and fetal movement were reviewed in detail with the patient. Please refer to After Visit Summary for other counseling recommendations.  Return in about 1 week (around 02/08/2017) for weekly NST/BPP and HOB - has US on 2/4 @ 1045.   Levie HeritageStinson, Darus Hershman J, DO

## 2017-02-01 NOTE — Progress Notes (Signed)
Interpreter [name] present for encounter. Per conversation w/pt it is clear she does not eat much protein in her diet. Pt advised of protein sources to include in her diet and also advised to review her printed instructions previously given. Pt reports increased episodes of acid reflux.

## 2017-02-08 ENCOUNTER — Ambulatory Visit: Payer: Self-pay

## 2017-02-08 ENCOUNTER — Other Ambulatory Visit: Payer: Medicaid Other

## 2017-02-08 ENCOUNTER — Ambulatory Visit (INDEPENDENT_AMBULATORY_CARE_PROVIDER_SITE_OTHER): Payer: Self-pay | Admitting: Obstetrics & Gynecology

## 2017-02-08 ENCOUNTER — Ambulatory Visit (INDEPENDENT_AMBULATORY_CARE_PROVIDER_SITE_OTHER): Payer: Self-pay | Admitting: *Deleted

## 2017-02-08 ENCOUNTER — Encounter: Payer: Medicaid Other | Admitting: Obstetrics & Gynecology

## 2017-02-08 VITALS — BP 117/82 | HR 91 | Wt 249.3 lb

## 2017-02-08 DIAGNOSIS — O09523 Supervision of elderly multigravida, third trimester: Secondary | ICD-10-CM

## 2017-02-08 DIAGNOSIS — O24415 Gestational diabetes mellitus in pregnancy, controlled by oral hypoglycemic drugs: Secondary | ICD-10-CM

## 2017-02-08 DIAGNOSIS — O0993 Supervision of high risk pregnancy, unspecified, third trimester: Secondary | ICD-10-CM

## 2017-02-08 NOTE — Progress Notes (Signed)
Interpreter Maretta LosBlanca Lindner present for encounter.  US for growth and BPP scheduled on 2/4.  IOL scheduled on 2/6 @ 0630.

## 2017-02-08 NOTE — Progress Notes (Signed)

## 2017-02-08 NOTE — Patient Instructions (Signed)
Induccin del trabajo de parto  (Labor Induction)  Se denomina induccin del trabajo de parto cuando se inician acciones para hacer que una mujer embarazada comience el trabajo de parto. La mayora de las mujeres comienzan el trabajo de parto sin ayuda entre las semanas 37 y 42 del embarazo. Cuando esto no ocurre o cuando hay una necesidad mdica, pueden utilizarse diferentes mtodos para inducirlo. La induccin del trabajo de parto hace que el tero se contraiga. Tambin hace que el cuello del tero se ablandemadure), se abra (se dilate), y se afine (se borre). Generalmente el trabajo de parto no se induce antes de las 39 semanas excepto que haya un problema con el beb o con la madre.  Antes de inducir el trabajo de parto, el mdico considerar cierto nmero de factores incluyendo los siguientes:   El estado del beb.   Cuntas semanas tiene de embarazo.   La madurez de los pulmones del beb.   El estado del cuello del tero.   La posicin del beb.  CULES SON LOS MOTIVOS PARA INDUCIR UN PARTO?  El trabajo de parto puede inducirse por las siguientes razones:   La salud del beb o de la madre estn en riesgo.   El embarazo se ha pasado de trmino en 1 semana o ms.   Ha roto la bolsa de aguas pero no se ha iniciado el trabajo de parto por s mismo.   La madre tiene algn trastorno de salud o una enfermedad grave, como hipertensin arterial, una infeccin, desprendimiento abrupto de la placenta o diabetes.   Hay escaso lquido amnitico alrededor del beb.   El beb presenta sufrimiento.  La conveniencia o el deseo de que el beb nazca en una cierta fecha no es un motivo para inducir el parto.  CULES SON LOS MTODOS UTILIZADOS PARA INDUCIR EL TRABAJO DE PARTO?  Algunos mtodos de induccin del trabajo de parto son:   Administracin del medicamentos prostaglandina. Este medicamento hace que el cuello uterino se dilate y madure. Este medicamento tambin iniciar las contracciones. Puede tomarse por  boca o insertarse en la vagina en forma de supositorio.   Insercin en la vagina de un tubo delgado (catter) con un baln en el extremo para dilatar el cuello del tero. Una vez insertado, el baln se infla con agua, lo que provoca la apertura del cuello del tero.   Ruptura de las membranas. El mdico separa el saco amnitico del cuello uterino, haciendo que el cuello uterino se distienda y cause la liberacin de la hormona llamada progesterona. Esto hace que el tero se contraiga. Este procedimiento se realiza durante una visita al consultorio mdico. Le indicarn que vuelva a su casa y espere que se inicien las contracciones. Luego tendr que volver para la induccin.   Ruptura de la bolsa de aguas. El mdico romper el saco amnitico con un pequeo instrumento. Una vez que el saco amnitico se rompe, las contracciones deben comenzar. Pueden pasar algunas horas hasta que haga efecto.   Medicamentos que desencadenen o intensifiquen las contracciones. Se lo administrarn a travs de un catter por va intravenosa (IV) que se inserta en una de las venas del brazo.  Todos los mtodos de induccin, excepto la ruptura de membranas, se realizan en el hospital. La induccin se realizar en el hospital, de modo que usted y el beb puedan ser controlados cuidadosamente.  CUNTO TIEMPO LLEVA INDUCIR EL TRABAJO DE PARTO?  Algunas inducciones pueden demorar entre 2 y 3 das. Generalmente lleva menos   tiempo, dependiendo del estado del cuello del tero. Puede tomar ms tiempo si la induccin se realiza en etapas tempranas del embarazo o es su primer embarazo. Si han pasado 2 o 3 das y no se inicia el trabajo de parto, podrn enviarla a su casa o realizar una cesrea.  CULES SON LOS RIESGOS ASOCIADOS CON LA INDUCCiN DEL TRABAJO DE PARTO?  Algunos de los riesgos de la induccin son:   Cambios en la frecuencia cardaca fetal, por ejemplo los latidos son demasiado rpidos, o lentos, o errticos.   Riesgo de distrs  fetal.   Posibilidad de infeccin en la madre o el beb.   Aumento de la posibilidad de que sea necesaria una cesrea.   Ruptura (abrupcin) de la placenta del tero (raro).   Ruptura uterina (muy raro).  Cuando es necesario realizar la induccin por razones mdicas, los beneficios deben superar a los riesgos.  CULES SON ALGUNAS RAZONES PARA NO INDUCIR EL TRABAJO DE PARTO?  La induccin no debe realizarse si:   Se demuestra que el beb no tolera el trabajo de parto.   Fue sometida anteriormente a cirugas en el tero, como una miomectoma o le han extirpado fibromas.   La placenta est en una posicin muy baja en el tero y obstruye la abertura del cuello (placenta previa).   El beb no est ubicado con la cabeza hacia bajo.   El cordn umbilical cae hacia el canal de parto, adelante del beb. Esto puede cortar el suministro de sangre y oxgeno al beb.   Fue sometida a una cesrea anteriormente.   Hay circunstancias poco habituales, como que el beb es extremadamente prematuro.  Esta informacin no tiene como fin reemplazar el consejo del mdico. Asegrese de hacerle al mdico cualquier pregunta que tenga.  Document Released: 04/07/2007 Document Revised: 04/22/2015 Document Reviewed: 07/28/2012  Elsevier Interactive Patient Education  2017 [employer]

## 2017-02-09 ENCOUNTER — Encounter: Payer: Self-pay | Admitting: *Deleted

## 2017-02-10 ENCOUNTER — Telehealth (HOSPITAL_COMMUNITY): Payer: Self-pay | Admitting: *Deleted

## 2017-02-10 NOTE — Telephone Encounter (Signed)
Preadmission screen Interpreter number [phone]

## 2017-02-12 ENCOUNTER — Other Ambulatory Visit: Payer: Self-pay | Admitting: Advanced Practice Midwife

## 2017-02-15 ENCOUNTER — Encounter (HOSPITAL_COMMUNITY): Payer: Self-pay

## 2017-02-15 ENCOUNTER — Ambulatory Visit (INDEPENDENT_AMBULATORY_CARE_PROVIDER_SITE_OTHER): Payer: Self-pay | Admitting: *Deleted

## 2017-02-15 ENCOUNTER — Ambulatory Visit (INDEPENDENT_AMBULATORY_CARE_PROVIDER_SITE_OTHER): Payer: Self-pay | Admitting: Obstetrics and Gynecology

## 2017-02-15 ENCOUNTER — Ambulatory Visit (HOSPITAL_COMMUNITY)
Admission: RE | Admit: 2017-02-15 | Discharge: 2017-02-15 | Disposition: A | Discharge status: Home / Self Care | Payer: Medicaid Other | Source: Ambulatory Visit | Attending: Obstetrics and Gynecology | Admitting: Obstetrics and Gynecology

## 2017-02-15 VITALS — BP 114/76 | HR 69 | Wt 252.8 lb

## 2017-02-15 DIAGNOSIS — O0990 Supervision of high risk pregnancy, unspecified, unspecified trimester: Secondary | ICD-10-CM

## 2017-02-15 DIAGNOSIS — O99213 Obesity complicating pregnancy, third trimester: Secondary | ICD-10-CM | POA: Insufficient documentation

## 2017-02-15 DIAGNOSIS — Z789 Other specified health status: Secondary | ICD-10-CM

## 2017-02-15 DIAGNOSIS — O09523 Supervision of elderly multigravida, third trimester: Secondary | ICD-10-CM | POA: Insufficient documentation

## 2017-02-15 DIAGNOSIS — Z3A38 38 weeks gestation of pregnancy: Secondary | ICD-10-CM | POA: Insufficient documentation

## 2017-02-15 DIAGNOSIS — O24415 Gestational diabetes mellitus in pregnancy, controlled by oral hypoglycemic drugs: Secondary | ICD-10-CM

## 2017-02-15 DIAGNOSIS — O0993 Supervision of high risk pregnancy, unspecified, third trimester: Secondary | ICD-10-CM

## 2017-02-15 DIAGNOSIS — O3660X0 Maternal care for excessive fetal growth, unspecified trimester, not applicable or unspecified: Secondary | ICD-10-CM

## 2017-02-15 DIAGNOSIS — O24419 Gestational diabetes mellitus in pregnancy, unspecified control: Secondary | ICD-10-CM

## 2017-02-15 NOTE — Progress Notes (Signed)
Live interpreter present for encounter.  US for growth and BPP today @ 1045.  IOL scheduled on 2/6

## 2017-02-15 NOTE — Progress Notes (Signed)
Prenatal Visit Note Date: 02/15/2017 Clinic: [facility]  Subjective:  Rebecca Ruiz is a 42 y.o. [id number] at 4948w5d being seen today for ongoing prenatal care.  She is currently monitored for the following issues for this high-risk pregnancy and has History of pre-eclampsia; Advanced maternal age in multigravida; History of preterm delivery; Grand multiparity; Supervision of high risk pregnancy, antepartum; History of gestational diabetes; GDM, class A2; Language barrier; and LGA (large for gestational age) fetus affecting management of mother on their problem list.  Patient reports no complaints.   Contractions: Irregular. Vag. Bleeding: None.  Movement: Present. Denies leaking of fluid.   The following portions of the patient's history were reviewed and updated as appropriate: allergies, current medications, past family history, past medical history, past social history, past surgical history and problem list. Problem list updated.  Objective:   Vitals:   02/15/17 0833  BP: 114/76  Pulse: 69  Weight: 252 lb 12.8 oz (114.7 kg)    Fetal Status: Fetal Heart Rate (bpm): NST   Movement: Present  Presentation: Vertex  General:  Alert, oriented and cooperative. Patient is in no acute distress.  Skin: Skin is warm and dry. No rash noted.   Cardiovascular: Normal heart rate noted  Respiratory: Normal respiratory effort, no problems with respiration noted  Abdomen: Soft, gravid, appropriate for gestational age. Pain/Pressure: Present     Pelvic:  Cervical exam deferred        Extremities: Normal range of motion.     Mental Status: Normal mood and affect. Normal behavior. Normal judgment and thought content.   Urinalysis:      Assessment and Plan:  Pregnancy: [id number] at 5248w5d  1. Supervision of high risk pregnancy in third trimester Routine care. IUD  2. Gestational diabetes mellitus (GDM) in third trimester controlled on oral hypoglycemic drug RNST. F/u  bpp and growth for today. Doing well on glyburide 5/7.5 (with dinner). For 2/6 IOL  3. Supervision of elderly multigravida in third trimester No issues  4. Excessive fetal growth affecting management of pregnancy, antepartum, single or unspecified fetus F/u scan today. Was 3500gm about 3wks ago  5. Language barrier Interpreter used  Term labor symptoms and general obstetric precautions including but not limited to vaginal bleeding, contractions, leaking of fluid and fetal movement were reviewed in detail with the patient. Please refer to After Visit Summary for other counseling recommendations.  Return in about 5 weeks (around 03/22/2017) for PP visit.  IOL on 2/6.   [place] BingPickens, Zeev Deakins, MD

## 2017-02-17 ENCOUNTER — Other Ambulatory Visit: Payer: Self-pay

## 2017-02-17 ENCOUNTER — Inpatient Hospital Stay (HOSPITAL_COMMUNITY): Payer: Medicaid Other | Admitting: Anesthesiology

## 2017-02-17 ENCOUNTER — Encounter (HOSPITAL_COMMUNITY): Payer: Self-pay

## 2017-02-17 ENCOUNTER — Inpatient Hospital Stay (HOSPITAL_COMMUNITY)
Admission: RE | Admit: 2017-02-17 | Discharge: 2017-02-20 | DRG: 807 | Disposition: A | Discharge status: Home / Self Care | Payer: Medicaid Other | Source: Ambulatory Visit | Attending: Obstetrics and Gynecology | Admitting: Obstetrics and Gynecology

## 2017-02-17 DIAGNOSIS — O3663X0 Maternal care for excessive fetal growth, third trimester, not applicable or unspecified: Secondary | ICD-10-CM | POA: Diagnosis present

## 2017-02-17 DIAGNOSIS — D649 Anemia, unspecified: Secondary | ICD-10-CM | POA: Diagnosis present

## 2017-02-17 DIAGNOSIS — O9902 Anemia complicating childbirth: Secondary | ICD-10-CM | POA: Diagnosis present

## 2017-02-17 DIAGNOSIS — O1494 Unspecified pre-eclampsia, complicating childbirth: Secondary | ICD-10-CM | POA: Diagnosis present

## 2017-02-17 DIAGNOSIS — O3660X0 Maternal care for excessive fetal growth, unspecified trimester, not applicable or unspecified: Secondary | ICD-10-CM | POA: Diagnosis present

## 2017-02-17 DIAGNOSIS — Z641 Problems related to multiparity: Secondary | ICD-10-CM

## 2017-02-17 DIAGNOSIS — O99214 Obesity complicating childbirth: Secondary | ICD-10-CM | POA: Diagnosis present

## 2017-02-17 DIAGNOSIS — Z880 Allergy status to penicillin: Secondary | ICD-10-CM | POA: Diagnosis not present

## 2017-02-17 DIAGNOSIS — O24429 Gestational diabetes mellitus in childbirth, unspecified control: Secondary | ICD-10-CM | POA: Diagnosis not present

## 2017-02-17 DIAGNOSIS — O24425 Gestational diabetes mellitus in childbirth, controlled by oral hypoglycemic drugs: Secondary | ICD-10-CM | POA: Diagnosis present

## 2017-02-17 DIAGNOSIS — Z3A39 39 weeks gestation of pregnancy: Secondary | ICD-10-CM | POA: Diagnosis not present

## 2017-02-17 DIAGNOSIS — O1414 Severe pre-eclampsia complicating childbirth: Secondary | ICD-10-CM | POA: Diagnosis not present

## 2017-02-17 DIAGNOSIS — O24419 Gestational diabetes mellitus in pregnancy, unspecified control: Secondary | ICD-10-CM | POA: Diagnosis present

## 2017-02-17 DIAGNOSIS — O1490 Unspecified pre-eclampsia, unspecified trimester: Secondary | ICD-10-CM

## 2017-02-17 LAB — PROTEIN / CREATININE RATIO, URINE
CREATININE, URINE: 27 mg/dL
Protein Creatinine Ratio: 0.37 mg/mg{Cre} — ABNORMAL HIGH (ref 0.00–0.15)
Total Protein, Urine: 10 mg/dL

## 2017-02-17 LAB — COMPREHENSIVE METABOLIC PANEL
ALBUMIN: 2.9 g/dL — AB (ref 3.5–5.0)
ALT: 21 U/L (ref 14–54)
AST: 24 U/L (ref 15–41)
Alkaline Phosphatase: 138 U/L — ABNORMAL HIGH (ref 38–126)
Anion gap: 10 (ref 5–15)
BUN: 7 mg/dL (ref 6–20)
CHLORIDE: 103 mmol/L (ref 101–111)
CO2: 20 mmol/L — AB (ref 22–32)
Calcium: 8.9 mg/dL (ref 8.9–10.3)
Creatinine, Ser: 0.44 mg/dL (ref 0.44–1.00)
GFR calc Af Amer: >60 mL/min (ref >60)
GLUCOSE: 97 mg/dL (ref 65–99)
POTASSIUM: 3.8 mmol/L (ref 3.5–5.1)
SODIUM: 133 mmol/L — AB (ref 135–145)
Total Bilirubin: 0.4 mg/dL (ref 0.3–1.2)
Total Protein: 7.2 g/dL (ref 6.5–8.1)

## 2017-02-17 LAB — CBC
HEMATOCRIT: 33.4 % — AB (ref 36.0–46.0)
Hemoglobin: 11.4 g/dL — ABNORMAL LOW (ref 12.0–15.0)
MCH: 28.4 pg (ref 26.0–34.0)
MCHC: 34.1 g/dL (ref 30.0–36.0)
MCV: 83.3 fL (ref 78.0–100.0)
Platelets: 212 10*3/uL (ref 150–400)
RBC: 4.01 MIL/uL (ref 3.87–5.11)
RDW: 16.9 % — AB (ref 11.5–15.5)
WBC: 7.8 10*3/uL (ref 4.0–10.5)

## 2017-02-17 LAB — TYPE AND SCREEN
ABO/RH(D): AB POS
Antibody Screen: NEGATIVE

## 2017-02-17 LAB — RPR: RPR Ser Ql: NONREACTIVE

## 2017-02-17 LAB — GLUCOSE, CAPILLARY
GLUCOSE-CAPILLARY: 109 mg/dL — AB (ref 65–99)
GLUCOSE-CAPILLARY: 94 mg/dL (ref 65–99)
GLUCOSE-CAPILLARY: 78 mg/dL (ref 65–99)

## 2017-02-17 MED ORDER — SOD CITRATE-CITRIC ACID 500-334 MG/5ML PO SOLN: 30.0000 mL | ORAL | Status: DC | PRN

## 2017-02-17 MED ORDER — ONDANSETRON HCL 4 MG/2ML IJ SOLN: 4.0000 mg | Freq: Four times a day (QID) | INTRAMUSCULAR | Status: DC | PRN

## 2017-02-17 MED ORDER — OXYCODONE-ACETAMINOPHEN 5-325 MG PO TABS: 1.0000 | ORAL_TABLET | ORAL | Status: DC | PRN

## 2017-02-17 MED ORDER — DIPHENHYDRAMINE HCL 50 MG/ML IJ SOLN: 12.5000 mg | INTRAMUSCULAR | Status: DC | PRN

## 2017-02-17 MED ORDER — FENTANYL 2.5 MCG/ML BUPIVACAINE 1/10 % EPIDURAL INFUSION (WH - ANES)
14.0000 mL/h | INTRAMUSCULAR | Status: DC | PRN
  Administered 2017-02-17: 10 mL/h via EPIDURAL
  Filled 2017-02-17: qty 100

## 2017-02-17 MED ORDER — MISOPROSTOL 50MCG HALF TABLET
50.0000 ug | ORAL_TABLET | ORAL | Status: DC
  Administered 2017-02-17: 50 ug via BUCCAL
  Filled 2017-02-17: qty 1

## 2017-02-17 MED ORDER — LACTATED RINGERS IV SOLN: 500.0000 mL | INTRAVENOUS | Status: DC | PRN

## 2017-02-17 MED ORDER — OXYCODONE-ACETAMINOPHEN 5-325 MG PO TABS: 2.0000 | ORAL_TABLET | ORAL | Status: DC | PRN

## 2017-02-17 MED ORDER — LIDOCAINE HCL (PF) 1 % IJ SOLN
30.0000 mL | INTRAMUSCULAR | Status: DC | PRN
  Filled 2017-02-17: qty 30

## 2017-02-17 MED ORDER — LACTATED RINGERS IV SOLN
500.0000 mL | Freq: Once | INTRAVENOUS | Status: AC
  Administered 2017-02-17: 500 mL via INTRAVENOUS

## 2017-02-17 MED ORDER — TERBUTALINE SULFATE 1 MG/ML IJ SOLN
0.2500 mg | Freq: Once | INTRAMUSCULAR | Status: DC | PRN
  Filled 2017-02-17: qty 1

## 2017-02-17 MED ORDER — EPHEDRINE 5 MG/ML INJ
10.0000 mg | INTRAVENOUS | Status: DC | PRN
  Filled 2017-02-17: qty 2

## 2017-02-17 MED ORDER — PHENYLEPHRINE 40 MCG/ML (10ML) SYRINGE FOR IV PUSH (FOR BLOOD PRESSURE SUPPORT)
80.0000 ug | PREFILLED_SYRINGE | INTRAVENOUS | Status: DC | PRN
  Filled 2017-02-17: qty 10
  Filled 2017-02-17: qty 5

## 2017-02-17 MED ORDER — ACETAMINOPHEN 325 MG PO TABS: 650.0000 mg | ORAL_TABLET | ORAL | Status: DC | PRN

## 2017-02-17 MED ORDER — OXYTOCIN 40 UNITS IN LACTATED RINGERS INFUSION - SIMPLE MED: 2.5000 [IU]/h | INTRAVENOUS | Status: DC

## 2017-02-17 MED ORDER — OXYTOCIN 40 UNITS IN LACTATED RINGERS INFUSION - SIMPLE MED
1.0000 m[IU]/min | INTRAVENOUS | Status: DC
  Administered 2017-02-17 (×2): 2 m[IU]/min via INTRAVENOUS
  Administered 2017-02-17: 8 m[IU]/min via INTRAVENOUS
  Filled 2017-02-17: qty 1000

## 2017-02-17 MED ORDER — PHENYLEPHRINE 40 MCG/ML (10ML) SYRINGE FOR IV PUSH (FOR BLOOD PRESSURE SUPPORT)
80.0000 ug | PREFILLED_SYRINGE | INTRAVENOUS | Status: DC | PRN
  Filled 2017-02-17: qty 5

## 2017-02-17 MED ORDER — FENTANYL CITRATE (PF) 100 MCG/2ML IJ SOLN
100.0000 ug | INTRAMUSCULAR | Status: DC | PRN
  Administered 2017-02-17 – 2017-02-18 (×4): 100 ug via INTRAVENOUS
  Filled 2017-02-17 (×4): qty 2

## 2017-02-17 MED ORDER — OXYTOCIN BOLUS FROM INFUSION
500.0000 mL | Freq: Once | INTRAVENOUS | Status: AC
  Administered 2017-02-18: 500 mL via INTRAVENOUS

## 2017-02-17 MED ORDER — LACTATED RINGERS IV SOLN
INTRAVENOUS | Status: DC
  Administered 2017-02-17 – 2017-02-18 (×2): via INTRAVENOUS

## 2017-02-17 NOTE — Progress Notes (Signed)
Rebecca ButtnerJosefina Candyce Churninoco Ruiz is a 42 y.o. [id number] at 2523w0d by LMP admitted for induction of labor due to Gestational diabetes.  Subjective: Patient with some discomfort with contractions now. Would like to continue with IV pain medication at this time.  Objective: BP (!) 148/77   Pulse (!) 55   Temp 98.1 F (36.7 C) (Oral)   Resp 18   Ht 5\' 3"  (1.6 m)   Wt 254 lb 6.4 oz (115.4 kg)   LMP 05/20/2016 (Exact Date)   BMI 45.06 kg/m  No intake/output data recorded. No intake/output data recorded.  FHT:  FHR: 145 bpm, variability: moderate,  accelerations:  Present,  decelerations:  Absent UC:   regular, every 1-4 minutes SVE:   Dilation: 4 Effacement (%): 70 Station: -3 Exam by:: Dr. Luberta RobertsonWinborne  Labs: Lab Results  Component Value Date   WBC 7.8 02/17/2017   HGB 11.4 (L) 02/17/2017   HCT 33.4 (L) 02/17/2017   MCV 83.3 02/17/2017   PLT 212 02/17/2017    Assessment / Plan: Induction of labor due to gestational diabetes,  progressing well on pitocin  Labor: Progressing on Pitocin, will continue to increase then AROM Preeclampsia:  no signs or symptoms of toxicity Fetal Wellbeing:  Category I Pain Control:  IV pain meds I/D:  n/a Anticipated MOD:  NSVD  Rebecca Ruiz 02/17/2017, 4:30 PM

## 2017-02-17 NOTE — Anesthesia Pain Management Evaluation Note (Signed)
  CRNA Pain Management Visit Note  Patient: Rebecca Ruiz, 42 y.o., female  "Hello I am a member of the [facility] team [facility]. We have an anesthesia team available at all times to provide care throughout the hospital, including epidural management and anesthesia for C-section. I don't know your plan for the delivery whether it a natural birth, water birth, IV sedation, nitrous supplementation, doula or epidural, but we want to meet your pain goals."   1.Was your pain managed to your expectations on prior hospitalizations?   Yes   2.What is your expectation for pain management during this hospitalization?     Epidural  3.How can we help you reach that goal? Epidural when ready  Record the patient's initial score and the patient's pain goal.   Pain: 1  Pain Goal: 5 [facility] wants you to be able to say your pain was always managed very well.  Cleda ClarksBrowder, Danique Hartsough R 02/17/2017

## 2017-02-17 NOTE — Progress Notes (Signed)
Rebecca Ruiz is a 42 y.o. [id number] at 659w0d by LMP admitted for induction of labor due to Gestational diabetes.  Subjective: Patient resting comfortably. Minimal pain with contractions   Objective: BP 138/76   Pulse (!) 59   Temp 98.1 F (36.7 C) (Oral)   Resp 16   Ht 5\' 3"  (1.6 m)   Wt 254 lb 6.4 oz (115.4 kg)   LMP 05/20/2016 (Exact Date)   BMI 45.06 kg/m  No intake/output data recorded. No intake/output data recorded.  FHT:  FHR: 153 bpm, variability: moderate,  accelerations:  Present,  decelerations:  Present 2 variables resolved on own with IVF bolus  UC:   irregular,  SVE:   Dilation: 2.5 Effacement (%): 50 Station: -3 Exam by:: Dr. Luberta RobertsonWinborne  Labs: Lab Results  Component Value Date   WBC 7.8 02/17/2017   HGB 11.4 (L) 02/17/2017   HCT 33.4 (L) 02/17/2017   MCV 83.3 02/17/2017   PLT 212 02/17/2017    Assessment / Plan: IOL for A2GDM  Labor: cytotec x1 will start pitocin after 4 hours and check patient Preeclampsia:  no signs or symptoms of toxicity Fetal Wellbeing:  Category I Pain Control:  per patient request I/D:  n/a Anticipated MOD:  NSVD  Rebecca Ruiz 02/17/2017, 11:23 AM

## 2017-02-17 NOTE — Progress Notes (Signed)
LABOR PROGRESS NOTE  Rebecca Ruiz is a 42 y.o. [id number] at 765w0d  admitted for IOL for A2GDM. Now also has pre-eclampsia  Subjective: Pt feeling contractions, has been umconfortable. Some relief with IV pain meds; would like epidural  Objective: BP (!) 142/88   Pulse (!) 58   Temp 98.1 F (36.7 C) (Oral)   Resp 16   Ht 5\' 3"  (1.6 m)   Wt 254 lb 6.4 oz (115.4 kg)   LMP 05/20/2016 (Exact Date)   BMI 45.06 kg/m  or  Vitals:   02/17/17 1830 02/17/17 1901 02/17/17 1931 02/17/17 2001  BP: 134/84 (!) 144/92 (!) 145/86 (!) 142/88  Pulse: (!) 56 60 (!) 57 (!) 58  Resp: 16  16   Temp:      TempSrc:      Weight:      Height:        Las SVE Dilation: 4 Effacement (%): 50 Cervical Position: Posterior Station: Ballotable Presentation: Compound(head and hand; verified by SVE and US) Exam by:: Dr. Doroteo GlassmanPhelps FHT: baseline rate 150, moderate varibility, + acel, no decel Toco: not tracing well  Assessment / Plan: 42 y.o. [id number] at 705w0d here for IOL for A2GDM, now also has pre-eclampsia  Labor: Continue IV Pit Fetal Wellbeing:  Cat I Pain Control:  May have epidural Anticipated MOD:  SVD  Frederik PearJulie P Bristal Steffy, MD 02/17/2017, 9:15 PM

## 2017-02-17 NOTE — H&P (Signed)
LABOR AND DELIVERY ADMISSION HISTORY AND PHYSICAL NOTE  Rebecca Ruiz is a 42 y.o. female [phone] with IUP at 7763w0d by LMP presenting for IOL for A2GDM (on glyburide).  She reports positive fetal movement. She denies leakage of fluid or vaginal bleeding.  Prenatal History/Complications: [facility] Pregnancy complications:  - h/o pre-eclampsia - advanced maternal age in multigravida  - h/o preterm labor  - grand multiparity  - A2GDM (on glyburide)  - LGA fetus   Sono: @[redacted]w[redacted]d , CWD, normal anatomy, cephalic presentation, anterior placenta above cervical os, 3931g, >90% EFW  Clinic [facility] Prenatal Labs  Dating LMP Blood type: AB/Positive/-- (10/08 1428)   Genetic Screen  Quad:WNL Antibody:Negative (10/08 1428)  Anatomic US  Normal MFM recc serial growth US in 3rd trimester Rubella: Immune  GTT Early:   A1C 5.1            Third trimester: 99/210/166 RPR: Non Reactive (10/08 1428)   Flu vaccine 10/19/16 HBsAg: Negative (10/08 1428)   TDaP vaccine       11/23/16                                         HIV:   Non-reactive  Baby Food Breast                                            GBS: neg (For PCN allergy, check sensitivities)  Contraception  IUD Pap:WNL  Circumcision If boy, no   [facility] CF:Neg  Support Person Jose (FOB) SMA: 2 copies  Prenatal Classes  n/a G8P6 Hgb electrophoresis: Nml AA   Past Medical History: Past Medical History:  Diagnosis Date  . Cholestasis of pregnancy   . Pregnancy induced hypertension     Past Surgical History: Past Surgical History:  Procedure Laterality Date  . NO PAST SURGERIES      Obstetrical History: OB History    Gravida Para Term Preterm AB Living   8 6 4 2 1 6    SAB TAB Ectopic Multiple Live Births   1 0 0 0 6      Social History: Social History   Socioeconomic History  . Marital status: Single    Spouse name: Not on file  . Number of children: Not on file  . Years of education: Not on file   . Highest education level: Not on file  Social Needs  . Financial resource strain: Not on file  . Food insecurity - worry: Not on file  . Food insecurity - inability: Not on file  . Transportation needs - medical: Not on file  . Transportation needs - non-medical: Not on file  Occupational History  . Not on file  Tobacco Use  . Smoking status: Never Smoker  . Smokeless tobacco: Never Used  Substance and Sexual Activity  . Alcohol use: No  . Drug use: No  . Sexual activity: Yes  Other Topics Concern  . Not on file  Social History Narrative  . Not on file    Family History: Family History  Problem Relation Age of Onset  . Diabetes Mother   . Cancer Mother        colon    Allergies: Allergies  Allergen Reactions  . Penicillins Rash  Has patient had a PCN reaction causing immediate rash, facial/tongue/throat swelling, SOB or lightheadedness with hypotension: Yes Has patient had a PCN reaction causing severe rash involving mucus membranes or skin necrosis: Yes Has patient had a PCN reaction that required hospitalization No Has patient had a PCN reaction occurring within the last 10 years: No If all of the above answers are "NO", then may proceed with Cephalosporin use.    Medications Prior to Admission  Medication Sig Dispense Refill Last Dose  . ACCU-CHEK FASTCLIX LANCETS MISC 1 Device by Percutaneous route 4 (four) times daily. 100 each 12 Taking  . aspirin (ASPIRIN CHILDRENS) 81 MG chewable tablet Chew 1 tablet (81 mg total) by mouth daily. (Patient not taking: Reported on 02/15/2017) 30 tablet 5 Not Taking  . famotidine (PEPCID) 20 MG tablet Take 1 tablet (20 mg total) by mouth 2 (two) times daily. 60 tablet 3 Taking  . glucose blood (ACCU-CHEK GUIDE) test strip Use as instructed 100 each 12 Taking  . glyBURIDE (DIABETA) 5 MG tablet One tab po with breakfast and one and a half tabs po with dinner 60 tablet 3 Taking  . Prenatal Vit-Fe Fumarate-FA  (MULTIVITAMIN-PRENATAL) 27-0.8 MG TABS tablet Take 1 tablet by mouth daily at 12 noon.   Taking     Review of Systems  All systems reviewed and negative except as stated in HPI  Physical Exam Last menstrual period 05/20/2016, unknown if currently breastfeeding. General appearance: alert, oriented, NAD Lungs: normal respiratory effort Heart: regular rate Abdomen: soft, non-tender; gravid, FH appropriate for GA Extremities: No calf swelling or tenderness Presentation: cephalic Fetal monitoring: FHR 150, moderate variability, accelerations no decels Uterine activity: irregular    Prenatal labs: ABO, Rh: AB/Positive/-- (10/08 1428) Antibody: Negative (10/08 1428) Rubella: 6.22 (10/08 1428) RPR: Non Reactive (11/12 1148)  HBsAg: Negative (10/08 1428)  HIV: Non Reactive (11/12 1148)  GC/Chlamydia: negative 01/21/2017 GBS:   Neg 2-hr GTT: 99/210/166 Genetic screening:  Quad wnl  Anatomy US: normal   Prenatal Transfer Tool  Maternal Diabetes: Yes:  Diabetes Type:  Insulin/Medication controlled Genetic Screening: Normal Maternal Ultrasounds/Referrals: Normal Fetal Ultrasounds or other Referrals:  None Maternal Substance Abuse:  No Significant Maternal Medications:  None Significant Maternal Lab Results: None  No results found for this or any previous visit (from the past 24 hour(s)).  Patient Active Problem List   Diagnosis Date Noted  . LGA (large for gestational age) fetus affecting management of mother 02/15/2017  . Language barrier 01/21/2017  . GDM, class A2 12/23/2016  . History of pre-eclampsia 10/19/2016  . Advanced maternal age in multigravida 10/19/2016  . History of preterm delivery 10/19/2016  . Grand multiparity 10/19/2016  . Supervision of high risk pregnancy, antepartum 10/19/2016  . History of gestational diabetes 10/19/2016    Assessment: Rebecca Ruiz is a 42 y.o. [phone] at [redacted]w[redacted]d here for IOL for GDM  #Labor: IOL for A2GDM, 50%  effacement will give 1 dose cytotec buccally to soften cervix then start pitocing  2.5/50/-3 #Pain: Per patient request, would like epidural  #FWB: Category I #ID:  GBS neg #MOF: breast  #MOC:IUD #Circ:  No , boy  Oralia Manis 02/17/2017, 6:47 AM

## 2017-02-18 ENCOUNTER — Encounter (HOSPITAL_COMMUNITY): Payer: Self-pay

## 2017-02-18 DIAGNOSIS — O1490 Unspecified pre-eclampsia, unspecified trimester: Secondary | ICD-10-CM

## 2017-02-18 DIAGNOSIS — O1414 Severe pre-eclampsia complicating childbirth: Secondary | ICD-10-CM

## 2017-02-18 DIAGNOSIS — O24429 Gestational diabetes mellitus in childbirth, unspecified control: Secondary | ICD-10-CM

## 2017-02-18 DIAGNOSIS — Z3A39 39 weeks gestation of pregnancy: Secondary | ICD-10-CM

## 2017-02-18 DIAGNOSIS — O3663X0 Maternal care for excessive fetal growth, third trimester, not applicable or unspecified: Secondary | ICD-10-CM

## 2017-02-18 LAB — GLUCOSE, CAPILLARY: GLUCOSE-CAPILLARY: 96 mg/dL (ref 65–99)

## 2017-02-18 MED ORDER — METHYLERGONOVINE MALEATE 0.2 MG/ML IJ SOLN: 0.2000 mg | INTRAMUSCULAR | Status: DC | PRN

## 2017-02-18 MED ORDER — METHYLERGONOVINE MALEATE 0.2 MG/ML IJ SOLN
INTRAMUSCULAR | Status: AC
  Administered 2017-02-18: 0.2 mg
  Filled 2017-02-18: qty 1

## 2017-02-18 MED ORDER — SENNOSIDES-DOCUSATE SODIUM 8.6-50 MG PO TABS
2.0000 | ORAL_TABLET | ORAL | Status: DC
  Administered 2017-02-18 – 2017-02-19 (×2): 2 via ORAL
  Filled 2017-02-18 (×2): qty 2

## 2017-02-18 MED ORDER — METHYLERGONOVINE MALEATE 0.2 MG PO TABS: 0.2000 mg | ORAL_TABLET | ORAL | Status: DC | PRN

## 2017-02-18 MED ORDER — ACETAMINOPHEN 325 MG PO TABS: 650.0000 mg | ORAL_TABLET | ORAL | Status: DC | PRN

## 2017-02-18 MED ORDER — DIBUCAINE 1 % RE OINT: 1.0000 "application " | TOPICAL_OINTMENT | RECTAL | Status: DC | PRN

## 2017-02-18 MED ORDER — BENZOCAINE-MENTHOL 20-0.5 % EX AERO: 1.0000 "application " | INHALATION_SPRAY | CUTANEOUS | Status: DC | PRN

## 2017-02-18 MED ORDER — ONDANSETRON HCL 4 MG PO TABS: 4.0000 mg | ORAL_TABLET | ORAL | Status: DC | PRN

## 2017-02-18 MED ORDER — SIMETHICONE 80 MG PO CHEW: 80.0000 mg | CHEWABLE_TABLET | ORAL | Status: DC | PRN

## 2017-02-18 MED ORDER — IBUPROFEN 600 MG PO TABS
600.0000 mg | ORAL_TABLET | Freq: Four times a day (QID) | ORAL | Status: DC
  Administered 2017-02-18 – 2017-02-20 (×9): 600 mg via ORAL
  Filled 2017-02-18 (×9): qty 1

## 2017-02-18 MED ORDER — OXYCODONE HCL 5 MG PO TABS
5.0000 mg | ORAL_TABLET | Freq: Four times a day (QID) | ORAL | Status: DC | PRN
  Filled 2017-02-18: qty 1

## 2017-02-18 MED ORDER — LIDOCAINE HCL (PF) 1 % IJ SOLN
INTRAMUSCULAR | Status: DC | PRN
  Administered 2017-02-17 (×2): 4 mL via EPIDURAL

## 2017-02-18 MED ORDER — WITCH HAZEL-GLYCERIN EX PADS: 1.0000 "application " | MEDICATED_PAD | CUTANEOUS | Status: DC | PRN

## 2017-02-18 MED ORDER — OXYCODONE-ACETAMINOPHEN 5-325 MG PO TABS
1.0000 | ORAL_TABLET | Freq: Once | ORAL | Status: AC | PRN
  Administered 2017-02-18: 1 via ORAL
  Filled 2017-02-18: qty 1

## 2017-02-18 MED ORDER — GENTAMICIN SULFATE 40 MG/ML IJ SOLN: 1.5000 mg/kg | Freq: Once | INTRAVENOUS | Status: DC

## 2017-02-18 MED ORDER — DIPHENHYDRAMINE HCL 25 MG PO CAPS: 25.0000 mg | ORAL_CAPSULE | Freq: Four times a day (QID) | ORAL | Status: DC | PRN

## 2017-02-18 MED ORDER — COCONUT OIL OIL: 1.0000 "application " | TOPICAL_OIL | Status: DC | PRN

## 2017-02-18 MED ORDER — GENTAMICIN SULFATE 40 MG/ML IJ SOLN
Freq: Once | INTRAMUSCULAR | Status: AC
  Administered 2017-02-18: 09:00:00 via INTRAVENOUS
  Filled 2017-02-18: qty 4.25

## 2017-02-18 MED ORDER — ONDANSETRON HCL 4 MG/2ML IJ SOLN: 4.0000 mg | INTRAMUSCULAR | Status: DC | PRN

## 2017-02-18 MED ORDER — CEFAZOLIN SODIUM-DEXTROSE 1-4 GM/50ML-% IV SOLN: 1.0000 g | Freq: Once | INTRAVENOUS | Status: DC

## 2017-02-18 MED ORDER — CLINDAMYCIN PHOSPHATE 900 MG/50ML IV SOLN: 900.0000 mg | Freq: Once | INTRAVENOUS | Status: DC

## 2017-02-18 MED ORDER — ZOLPIDEM TARTRATE 5 MG PO TABS: 5.0000 mg | ORAL_TABLET | Freq: Every evening | ORAL | Status: DC | PRN

## 2017-02-18 MED ORDER — PRENATAL MULTIVITAMIN CH
1.0000 | ORAL_TABLET | Freq: Every day | ORAL | Status: DC
  Administered 2017-02-18 – 2017-02-19 (×2): 1 via ORAL
  Filled 2017-02-18 (×2): qty 1

## 2017-02-18 MED ORDER — TETANUS-DIPHTH-ACELL PERTUSSIS 5-2.5-18.5 LF-MCG/0.5 IM SUSP: 0.5000 mL | Freq: Once | INTRAMUSCULAR | Status: DC

## 2017-02-18 NOTE — Anesthesia Preprocedure Evaluation (Signed)
Anesthesia Evaluation  Patient identified by MRN, date of birth, ID band Patient awake    Reviewed: Allergy & Precautions, H&P , NPO status , Patient's Chart, lab work & pertinent test results  History of Anesthesia Complications Negative for: history of anesthetic complications  Airway Mallampati: II  TM Distance: >3 FB Neck ROM: full    Dental no notable dental hx. (+) Teeth Intact   Pulmonary neg pulmonary ROS,    Pulmonary exam normal breath sounds clear to auscultation       Cardiovascular hypertension (PIH), Normal cardiovascular exam Rhythm:regular Rate:Normal     Neuro/Psych negative neurological ROS  negative psych ROS   GI/Hepatic negative GI ROS, Neg liver ROS,   Endo/Other  diabetes, Gestational, Oral Hypoglycemic AgentsMorbid obesity  Renal/GU negative Renal ROS  negative genitourinary   Musculoskeletal   Abdominal (+) + obese,   Peds  Hematology  (+) anemia ,   Anesthesia Other Findings   Reproductive/Obstetrics (+) Pregnancy                             Anesthesia Physical Anesthesia Plan  ASA: III  Anesthesia Plan: Epidural   Post-op Pain Management:    Induction:   PONV Risk Score and Plan:   Airway Management Planned:   Additional Equipment:   Intra-op Plan:   Post-operative Plan:   Informed Consent: I have reviewed the patients History and Physical, chart, labs and discussed the procedure including the risks, benefits and alternatives for the proposed anesthesia with the patient or authorized representative who has indicated his/her understanding and acceptance.     Plan Discussed with:   Anesthesia Plan Comments:         Anesthesia Quick Evaluation

## 2017-02-18 NOTE — Lactation Note (Signed)
This note was copied from a baby's chart. Lactation Consultation Note  Patient Name: Rebecca Ruiz YNWGN'FToday's Date: 02/18/2017 Reason for consult: Initial assessment;Term Breastfeeding consultation services and support information given in Spanish.  Mom desires to both breastfeed and formula feed.  She states baby is latching well.  Instructed to feed with feeding cues and call for assist prn.  Maternal Data    Feeding Feeding Type: Breast Fed Length of feed: 20 min  LATCH Score Latch: Grasps breast easily, tongue down, lips flanged, rhythmical sucking.  Audible Swallowing: A few with stimulation  Type of Nipple: Everted at rest and after stimulation  Comfort (Breast/Nipple): Soft / non-tender  Hold (Positioning): Assistance needed to correctly position infant at breast and maintain latch.  LATCH Score: 8  Interventions    Lactation Tools Discussed/Used     Consult Status Consult Status: Follow-up Date: 02/19/17 Follow-up type: In-patient    Huston FoleyMOULDEN, Mayo Owczarzak S 02/18/2017, 1:47 PM

## 2017-02-18 NOTE — Plan of Care (Signed)
  Progressing Activity: Will verbalize the importance of balancing activity with adequate rest periods 02/18/2017 1737 - Progressing by Princess PernaBurgess, Rashunda Passon D, RN Ability to tolerate increased activity will improve 02/18/2017 1737 - Progressing by Princess PernaBurgess, Tymia Streb D, RN Note Pt ambulates in room without difficulty. Coping: Ability to identify and utilize available resources and services will improve 02/18/2017 1737 - Progressing by Princess PernaBurgess, Zade Falkner D, RN Note Interpreter services used for education & assessments. Life Cycle: Chance of risk for complications during the postpartum period will decrease 02/18/2017 1737 - Progressing by Princess PernaBurgess, Barbie Croston D, RN Note Rebecca Ruiz has been firm & 1/U.  Bleeding WNL. Role Relationship: Ability to demonstrate positive interaction with newborn will improve 02/18/2017 1737 - Progressing by Claudette LawsBurgess, Senaida Chilcote D, RN Note Patient has been very engaged in care of newborn as evidenced by frequently holding infant.

## 2017-02-18 NOTE — Anesthesia Procedure Notes (Addendum)
Epidural Patient location during procedure: OB Start time: 02/17/2017 11:23 PM  Staffing Anesthesiologist: Leonides GrillsEllender, Teodoro Jeffreys P, MD Performed: anesthesiologist   Preanesthetic Checklist Completed: patient identified, site marked, pre-op evaluation, timeout performed, IV checked, risks and benefits discussed and monitors and equipment checked  Epidural Patient position: sitting Prep: DuraPrep Patient monitoring: heart rate, cardiac monitor, continuous pulse ox and blood pressure Approach: midline Location: L3-L4 Injection technique: LOR air  Needle:  Needle type: Tuohy  Needle gauge: 17 G Needle length: 9 cm Needle insertion depth: 8 cm Catheter type: closed end flexible Catheter size: 19 Gauge Catheter at skin depth: 13 cm Test dose: negative and Other  Assessment Events: blood not aspirated, injection not painful, no injection resistance and negative IV test  Additional Notes Informed consent obtained prior to proceeding including risk of failure, 1% risk of PDPH, risk of minor discomfort and bruising. Discussed alternatives to epidural analgesia and patient desires to proceed.  Timeout performed pre-procedure verifying patient name, procedure, and platelet count.  Loss of resistance encountered on the fourth attempt. Patient tolerated procedure well. Reason for block:procedure for pain

## 2017-02-18 NOTE — Anesthesia Postprocedure Evaluation (Signed)
Anesthesia Post Note  Patient: Rebecca Ruiz  Procedure(s) Performed: AN AD HOC LABOR EPIDURAL     Patient location during evaluation: Mother Baby Anesthesia Type: Epidural Level of consciousness: awake and alert and oriented Pain management: satisfactory to patient Vital Signs Assessment: post-procedure vital signs reviewed and stable Respiratory status: spontaneous breathing and nonlabored ventilation Cardiovascular status: stable Postop Assessment: no headache, no backache, no signs of nausea or vomiting, adequate PO intake and patient able to bend at knees (patient up walking) Anesthetic complications: no    Last Vitals:  Vitals:   02/18/17 0743 02/18/17 1102  BP: (!) 158/87 118/78  Pulse: 78 64  Resp: 20 20  Temp: 36.8 C 36.7 C  SpO2: 99% 98%    Last Pain:  Vitals:   02/18/17 1106  TempSrc:   PainSc: 0-No pain   Pain Goal:                 Zebedee Segundo

## 2017-02-18 NOTE — Progress Notes (Signed)
Bufford ButtnerJosefina Candyce Churninoco Gutierrez is a 42 y.o. [id number] at 1513w1d by LMP admitted for induction of labor due to A2GDM (on glyburide). Now also has pre-eclampsia without severe features.  Subjective: No questions at this time. Has had epidural and is unable to feel legs.   Objective: BP 120/76   Pulse 68   Temp 97.8 F (36.6 C) (Oral)   Resp 16   Ht 5\' 3"  (1.6 m)   Wt 115.4 kg (254 lb 6.4 oz)   LMP 05/20/2016 (Exact Date)   SpO2 100%   BMI 45.06 kg/m  No intake/output data recorded. No intake/output data recorded.  FHT:  FHR: 150 bpm, variability: moderate,  accelerations:  Present,  decelerations:  Present 1 late decel, followed by early decel UC: irregular  SVE:   Dilation: 5.5 Effacement (%): 90 Station: Ballotable Exam by:: Dr.Degele  Labs: Lab Results  Component Value Date   WBC 7.8 02/17/2017   HGB 11.4 (L) 02/17/2017   HCT 33.4 (L) 02/17/2017   MCV 83.3 02/17/2017   PLT 212 02/17/2017    Assessment / Plan: IOL for A2GDM now with pre-E  Labor: Continue IV pitocin, currently at 8 miliunits/min Preeclampsia:  no signs or symptoms of toxicity and labs stable Fetal Wellbeing:  Category II Pain Control:  Epidural I/D:  n/a Anticipated MOD:  NSVD  Sarabi Sockwell 02/18/2017, 12:15 AM

## 2017-02-19 LAB — INFLUENZA PANEL BY PCR (TYPE A & B)
Influenza A By PCR: NEGATIVE
Influenza B By PCR: NEGATIVE

## 2017-02-19 LAB — GLUCOSE, CAPILLARY: Glucose-Capillary: 109 mg/dL — ABNORMAL HIGH (ref 65–99)

## 2017-02-19 MED ORDER — OSELTAMIVIR PHOSPHATE 75 MG PO CAPS: 75.0000 mg | ORAL_CAPSULE | Freq: Two times a day (BID) | ORAL | 0 refills | Status: AC

## 2017-02-19 MED ORDER — IBUPROFEN 600 MG PO TABS: 600.0000 mg | ORAL_TABLET | Freq: Four times a day (QID) | ORAL | 0 refills | Status: AC

## 2017-02-19 MED ORDER — SENNOSIDES-DOCUSATE SODIUM 8.6-50 MG PO TABS: 2.0000 | ORAL_TABLET | ORAL | 0 refills | Status: AC

## 2017-02-19 NOTE — Lactation Note (Signed)
This note was copied from a baby's chart. Lactation Consultation Note  Patient Name: Rebecca Ruiz WUJWJ'XToday's Date: 02/19/2017 Reason for consult: Follow-up assessment;Term  Used Rolene CourseAngie Zegarra our house interpreter for interpreter services in [place].  5235 hours old female who is now being BF and formula fed by his mother. Baby was able to achieve a latch, but unable to sustain it, did some suck training and the suck was very weak and uncoordinated, per mom feedings at the breast were comfortable and she felt more of a "tug" than a "pinch" but when observing feeding, it was difficult to tell if baby was transferring. Mom expressed 3 cc of breastmilk and spoon fed back to baby. Explained to parents the importance of getting calories into baby, mom stated that baby has already been given formula yesterday and her long term plan was to do both, breast and formula. RN gave baby a Dr. Manson PasseyBrown preemie bottle since baby wasn't able to suck on a regular bottle; baby is on Gerber Gentle. Explained to parents formula storage, hand expression, manual pump and the importance of consistent BF to build up a milk supply. Offered to set up a DEBP mom said she'll think about it, but requested a hand pump to take home since she didn't have any. Before leaving the room RN placed a contact precaution cart by their door since mom was symptomatic for the flu but still needs to be tested.   Maternal Data    Feeding Feeding Type: Breast Fed Length of feed: 10 min  LATCH Score Latch: Repeated attempts needed to sustain latch, nipple held in mouth throughout feeding, stimulation needed to elicit sucking reflex.  Audible Swallowing: None  Type of Nipple: Everted at rest and after stimulation  Comfort (Breast/Nipple): Soft / non-tender  Hold (Positioning): Assistance needed to correctly position infant at breast and maintain latch.  LATCH Score: 6  Interventions Interventions: Breast feeding basics  reviewed;Assisted with latch;Skin to skin;Breast massage;Hand express;Breast compression;Adjust position;Support pillows;Position options;Expressed milk;Hand pump  Lactation Tools Discussed/Used Tools: Pump Breast pump type: Manual Pump Review: Setup, frequency, and cleaning Initiated by:: MPeck Date initiated:: 02/20/17   Consult Status Consult Status: Follow-up Date: 02/20/17 Follow-up type: In-patient    Alexus Michael Venetia ConstableS Jassiah Viviano 02/19/2017, 2:32 PM

## 2017-02-19 NOTE — Progress Notes (Signed)
Pt states that body aches, runny nose, and coughing has started since this am.  MD notified; orders received.  Flu swab has been sent, waiting for results.  Pt. Placed on droplet precautions.

## 2017-02-19 NOTE — Discharge Instructions (Signed)

## 2017-02-19 NOTE — Discharge Summary (Signed)
OB Discharge Summary     Patient Name: Rebecca Ruiz DOB: September 01, 1975 MRN: [id number]  Date of admission: 02/17/2017 Delivering MD: Frederik Pear   Date of discharge: 02/19/2017  Admitting diagnosis: INDUCTION Intrauterine pregnancy: [redacted]w[redacted]d     Secondary diagnosis:  Active Problems:   Grand multiparity   LGA (large for gestational age) fetus affecting management of mother   Gestational diabetes   Preeclampsia   SVD (spontaneous vaginal delivery)  Additional problems: none     Discharge diagnosis: Term Pregnancy Delivered and GDM A2                                                                                                Post partum procedures:none  Augmentation: Pitocin and Cytotec  Complications: continued PPD - bimanual massage, methergine, and bedside curettage   Hospital course:  Induction of Labor With Vaginal Delivery   42 y.o. yo [id number] at [redacted]w[redacted]d was admitted to the hospital 02/17/2017 for induction of labor.  Indication for induction: A2 DM.  Patient had an uncomplicated labor course as follows: Membrane Rupture Time/Date: 2:43 AM ,02/18/2017   Intrapartum Procedures: Episiotomy: None [1]                                         Lacerations:  None [1]  Patient had delivery of a Viable infant.  Information for the patient's newborn:  Denicia, Pagliarulo [[id number]]  Delivery Method: Vaginal, Spontaneous(Filed from Delivery Summary)   02/18/2017  Details of delivery can be found in separate delivery note.  Patient had a routine postpartum course. Patient is discharged home 02/19/17.  Physical exam  Interpretor #[id number] used for exam  Vitals:   02/18/17 0620 02/18/17 0743 02/18/17 1102 02/19/17 0602  BP: 129/70 (!) 158/87 118/78 116/67  Pulse: 71 78 64 70  Resp: 16 20 20 20   Temp: 99.4 F (37.4 C) 98.3 F (36.8 C) 98 F (36.7 C) 98 F (36.7 C)  TempSrc: Oral Oral Oral Oral  SpO2: 100% 99% 98% 99%  Weight:      Height:       General:  alert, cooperative and no distress Lochia: appropriate Uterine Fundus: firm Incision: N/A DVT Evaluation: No evidence of DVT seen on physical exam. No cords or calf tenderness. No significant calf/ankle edema. Labs: Lab Results  Component Value Date   WBC 7.8 02/17/2017   HGB 11.4 (L) 02/17/2017   HCT 33.4 (L) 02/17/2017   MCV 83.3 02/17/2017   PLT 212 02/17/2017   CMP Latest Ref Rng & Units 02/17/2017  Glucose 65 - 99 mg/dL 97  BUN 6 - 20 mg/dL 7  Creatinine 7.84 - 6.96 mg/dL 2.95  Sodium 284 - 132 mmol/L 133(L)  Potassium 3.5 - 5.1 mmol/L 3.8  Chloride 101 - 111 mmol/L 103  CO2 22 - 32 mmol/L 20(L)  Calcium 8.9 - 10.3 mg/dL 8.9  Total Protein 6.5 - 8.1 g/dL 7.2  Total Bilirubin 0.3 - 1.2 mg/dL 0.4  Alkaline Phos 38 -  126 U/L 138(H)  AST 15 - 41 U/L 24  ALT 14 - 54 U/L 21    Discharge instruction: per After Visit Summary and "Baby and Me Booklet".  After visit meds:  Allergies as of 02/19/2017      Reactions   Penicillins Rash   Has patient had a PCN reaction causing immediate rash, facial/tongue/throat swelling, SOB or lightheadedness with hypotension: Yes Has patient had a PCN reaction causing severe rash involving mucus membranes or skin necrosis: Yes Has patient had a PCN reaction that required hospitalization No Has patient had a PCN reaction occurring within the last 10 years: No If all of the above answers are "NO", then may proceed with Cephalosporin use.      Medication List    STOP taking these medications   aspirin 81 MG chewable tablet Commonly known as:  ASPIRIN CHILDRENS   glyBURIDE 5 MG tablet Commonly known as:  DIABETA     TAKE these medications   ACCU-CHEK FASTCLIX LANCETS Misc 1 Device by Percutaneous route 4 (four) times daily.   famotidine 20 MG tablet Commonly known as:  PEPCID Take 1 tablet (20 mg total) by mouth 2 (two) times daily.   glucose blood test strip Commonly known as:  ACCU-CHEK GUIDE Use as instructed   ibuprofen 600  MG tablet Commonly known as:  ADVIL,MOTRIN Take 1 tablet (600 mg total) by mouth every 6 (six) hours.   multivitamin-prenatal 27-0.8 MG Tabs tablet Take 1 tablet by mouth daily at 12 noon.   senna-docusate 8.6-50 MG tablet Commonly known as:  Senokot-S Take 2 tablets by mouth daily. Start taking on:  02/20/2017       Diet: carb modified diet  Activity: Advance as tolerated. Pelvic rest for 6 weeks.   Outpatient follow up:4 weeks Follow up Appt:No future appointments. Follow up Visit:No Follow-up on file. [facility] Information    [facility] Schedule an appointment as soon as possible for a visit in 4 week(s).   Why:  Please follow up in 4 weeks. Please be fasting for repeat glucola  Contact information: [address] [place] [phone]          Postpartum contraception: IUD    Newborn Data: Live born female  Birth Weight: 9 lb 0.5 oz (4095 g) APGAR: 9, 9  Newborn Delivery   Birth date/time:  02/18/2017 02:43:00 Delivery type:  Vaginal, Spontaneous     Baby Feeding: Breast Disposition:home with mother   02/19/2017, DO PGY-1 Oralia ManisSherin Abraham, DO   .I spoke with and examined patient and agree with resident/PA/SNM's note and plan of care.  Cathie BeamsFran Cresenzo-Dishmon, CNM 02/19/2017 7:37 AM

## 2017-02-19 NOTE — Progress Notes (Signed)
Pt stated with interpreter that she does not have insurance and is concerned about the price of tamiflu.  MD in the OR at this moment, spoke with him through OR RN.  He stated to cancel the discharge for patient.  She tested negative for flu (MD aware), but still has body aches, cough, runny nose, and congestion.  Will continue to monitor.

## 2017-02-20 NOTE — Discharge Summary (Signed)
OB Discharge Summary     Patient Name: Rebecca Ruiz DOB: 01-01-1976 MRN: [id number]  Date of admission: 02/17/2017 Delivering MD: Frederik Pear   Date of discharge: 02/20/2017  Admitting diagnosis: INDUCTION Intrauterine pregnancy: [redacted]w[redacted]d     Secondary diagnosis:  Active Problems:   Grand multiparity   LGA (large for gestational age) fetus affecting management of mother   Gestational diabetes   Preeclampsia   SVD (spontaneous vaginal delivery)  Additional problems: none     Discharge diagnosis: Term Pregnancy Delivered and GDM A2                                                                                                Post partum procedures:none  Augmentation: Pitocin and Cytotec  Complications: continued PPD - bimanual massage, methergine, and bedside curettage, flu like symptoms but flu negative   Hospital course:  Induction of Labor With Vaginal Delivery   42 y.o. yo [id number] at [redacted]w[redacted]d was admitted to the hospital 02/17/2017 for induction of labor.  Indication for induction: A2 DM.  Patient had an uncomplicated labor course as follows: Membrane Rupture Time/Date: 2:43 AM ,02/18/2017   Intrapartum Procedures: Episiotomy: None [1]                                         Lacerations:  None [1]  Patient had delivery of a Viable infant.  Information for the patient's newborn:  Rebecca, Ruiz [[id number]]  Delivery Method: Vaginal, Spontaneous(Filed from Delivery Summary)   02/18/2017  Details of delivery can be found in separate delivery note.  Patient had a routine postpartum course. Patient is discharged home 02/20/17.  Physical exam  Interpretor #[id number] used during encounter  Vitals:   02/19/17 0602 02/19/17 1333 02/19/17 1833 02/20/17 0500  BP: 116/67  124/79 122/79  Pulse: 70  90 72  Resp: 20   16  Temp: 98 F (36.7 C) 98.2 F (36.8 C) 98.3 F (36.8 C) 98.2 F (36.8 C)  TempSrc: Oral Axillary Axillary Oral  SpO2: 99%  98%   Weight:       Height:       General: alert, cooperative and no distress Lochia: appropriate Uterine Fundus: firm Incision: N/A DVT Evaluation: No evidence of DVT seen on physical exam. No cords or calf tenderness. No significant calf/ankle edema. Labs: Lab Results  Component Value Date   WBC 7.8 02/17/2017   HGB 11.4 (L) 02/17/2017   HCT 33.4 (L) 02/17/2017   MCV 83.3 02/17/2017   PLT 212 02/17/2017   CMP Latest Ref Rng & Units 02/17/2017  Glucose 65 - 99 mg/dL 97  BUN 6 - 20 mg/dL 7  Creatinine 1.32 - 4.40 mg/dL 1.02  Sodium 725 - 366 mmol/L 133(L)  Potassium 3.5 - 5.1 mmol/L 3.8  Chloride 101 - 111 mmol/L 103  CO2 22 - 32 mmol/L 20(L)  Calcium 8.9 - 10.3 mg/dL 8.9  Total Protein 6.5 - 8.1 g/dL 7.2  Total Bilirubin 0.3 - 1.2 mg/dL 0.4  Alkaline Phos 38 - 126 U/L 138(H)  AST 15 - 41 U/L 24  ALT 14 - 54 U/L 21    Discharge instruction: per After Visit Summary and "Baby and Me Booklet".  After visit meds:  Allergies as of 02/20/2017      Reactions   Penicillins Rash   Has patient had a PCN reaction causing immediate rash, facial/tongue/throat swelling, SOB or lightheadedness with hypotension: Yes Has patient had a PCN reaction causing severe rash involving mucus membranes or skin necrosis: Yes Has patient had a PCN reaction that required hospitalization No Has patient had a PCN reaction occurring within the last 10 years: No If all of the above answers are "NO", then may proceed with Cephalosporin use.      Medication List    STOP taking these medications   aspirin 81 MG chewable tablet Commonly known as:  ASPIRIN CHILDRENS   glyBURIDE 5 MG tablet Commonly known as:  DIABETA     TAKE these medications   ACCU-CHEK FASTCLIX LANCETS Misc 1 Device by Percutaneous route 4 (four) times daily.   famotidine 20 MG tablet Commonly known as:  PEPCID Take 1 tablet (20 mg total) by mouth 2 (two) times daily.   glucose blood test strip Commonly known as:  ACCU-CHEK GUIDE Use as  instructed   ibuprofen 600 MG tablet Commonly known as:  ADVIL,MOTRIN Take 1 tablet (600 mg total) by mouth every 6 (six) hours.   multivitamin-prenatal 27-0.8 MG Tabs tablet Take 1 tablet by mouth daily at 12 noon.   oseltamivir 75 MG capsule Commonly known as:  TAMIFLU Take 1 capsule (75 mg total) by mouth 2 (two) times daily.   senna-docusate 8.6-50 MG tablet Commonly known as:  Senokot-S Take 2 tablets by mouth daily.       Diet: carb modified diet  Activity: Advance as tolerated. Pelvic rest for 6 weeks.   Outpatient follow up:1 wk BP check, then 6wk PP visit with glucola Follow up Appt:No future appointments. Follow up Visit:No Follow-up on file. Follow-up Information    [facility]. Schedule an appointment as soon as possible for a visit in 4 week(s).   Why:  Please follow up in 1 week for BP check. Please also follow up in 4 weeks for postpartum check. Please be fasting for repeat glucola  Contact information: [address] [place] [address] [phone]          Postpartum contraception: IUD    Newborn Data: Live born female  Birth Weight: 9 lb 0.5 oz (4095 g) APGAR: 9, 9  Newborn Delivery   Birth date/time:  02/18/2017 02:43:00 Delivery type:  Vaginal, Spontaneous     Baby Feeding: Breast Disposition:home with mother   02/20/2017 Rebecca Manis, DO  PGY-1  CNM attestation I have seen and examined this patient and agree with above documentation in the resident's note.   Samamtha Dai Ruiz is a 42 y.o. [id number] s/p SVD.   Pain is well controlled.  Plan for birth control is IUD.  Method of Feeding: breast  PE:  BP 122/79 (BP Location: Right Arm)   Pulse 72   Temp 98.2 F (36.8 C) (Oral)   Resp 16   Ht 5\' 3"  (1.6 m)   Wt 115.4 kg (254 lb 6.4 oz)   LMP 05/20/2016 (Exact Date)   SpO2 98%   Breastfeeding? Unknown   BMI 45.06 kg/m  Fundus firm  No results for input(s): HGB, HCT in  the last 72 hours.   Plan: discharge today - postpartum care discussed - f/u clinic in 1wk for BP check and then 6 weeks for postpartum visit/glucola   Rebecca Ruiz, KIMBERLY, CNM 7:44 AM 02/20/2017

## 2017-02-22 ENCOUNTER — Telehealth: Payer: Self-pay | Admitting: General Practice

## 2017-02-22 NOTE — Telephone Encounter (Signed)
Called patient with interpreter to inform patient of appointment scheduled for 02/25/17 at 10:30am for BP check.  Patient voiced understanding.

## 2017-02-25 ENCOUNTER — Ambulatory Visit: Payer: Self-pay | Admitting: General Practice

## 2017-02-25 VITALS — BP 125/86 | HR 92 | Ht 63.0 in | Wt 234.0 lb

## 2017-02-25 DIAGNOSIS — Z0130 Encounter for examination of blood pressure without abnormal findings: Secondary | ICD-10-CM

## 2017-02-25 NOTE — Progress Notes (Signed)
[employer] Interpreter [phone]#[id number] used for today's encounter. Patient here for blood pressure check today. Patient denies headaches, dizziness, or blurry vision. No edema noted. Patient is not currently on BP meds. BP is WNL todays. Advised patient to follow up at scheduled pp visit. Patient verbalized understanding and had no questions

## 2017-03-02 ENCOUNTER — Other Ambulatory Visit: Payer: Self-pay

## 2017-03-30 ENCOUNTER — Ambulatory Visit: Payer: Self-pay | Admitting: Family Medicine

## 2017-03-30 ENCOUNTER — Other Ambulatory Visit: Payer: Self-pay

## 2018-08-17 ENCOUNTER — Other Ambulatory Visit: Payer: Self-pay

## 2018-08-17 DIAGNOSIS — Z20822 Contact with and (suspected) exposure to covid-19: Secondary | ICD-10-CM

## 2018-08-18 LAB — NOVEL CORONAVIRUS, NAA: SARS-CoV-2, NAA: NOT DETECTED

## 2018-09-01 ENCOUNTER — Other Ambulatory Visit: Payer: Self-pay

## 2018-09-01 DIAGNOSIS — Z20822 Contact with and (suspected) exposure to covid-19: Secondary | ICD-10-CM

## 2018-09-02 LAB — NOVEL CORONAVIRUS, NAA: SARS-CoV-2, NAA: NOT DETECTED

## 2021-03-12 ENCOUNTER — Other Ambulatory Visit (HOSPITAL_COMMUNITY): Payer: Self-pay | Admitting: Nurse Practitioner

## 2021-03-12 DIAGNOSIS — N921 Excessive and frequent menstruation with irregular cycle: Secondary | ICD-10-CM

## 2021-03-24 ENCOUNTER — Other Ambulatory Visit: Payer: Self-pay

## 2021-03-24 ENCOUNTER — Ambulatory Visit (HOSPITAL_COMMUNITY)
Admission: RE | Admit: 2021-03-24 | Discharge: 2021-03-24 | Disposition: A | Discharge status: Home / Self Care | Payer: Self-pay | Source: Ambulatory Visit | Attending: Nurse Practitioner | Admitting: Nurse Practitioner

## 2021-03-24 DIAGNOSIS — N921 Excessive and frequent menstruation with irregular cycle: Secondary | ICD-10-CM | POA: Insufficient documentation

## 2021-03-25 ENCOUNTER — Other Ambulatory Visit: Payer: Self-pay | Admitting: Pediatrics

## 2021-03-25 ENCOUNTER — Other Ambulatory Visit (HOSPITAL_COMMUNITY): Payer: Self-pay | Admitting: Nurse Practitioner

## 2021-03-25 ENCOUNTER — Other Ambulatory Visit: Payer: Self-pay | Admitting: Nurse Practitioner

## 2021-03-25 DIAGNOSIS — R9389 Abnormal findings on diagnostic imaging of other specified body structures: Secondary | ICD-10-CM

## 2021-04-23 ENCOUNTER — Other Ambulatory Visit: Payer: Self-pay | Admitting: *Deleted

## 2021-04-23 DIAGNOSIS — Z1231 Encounter for screening mammogram for malignant neoplasm of breast: Secondary | ICD-10-CM

## 2021-06-12 ENCOUNTER — Ambulatory Visit (HOSPITAL_COMMUNITY): Admission: RE | Admit: 2021-06-12 | Payer: Self-pay | Source: Ambulatory Visit

## 2021-06-12 ENCOUNTER — Encounter (HOSPITAL_COMMUNITY): Payer: Self-pay

## 2021-07-07 ENCOUNTER — Inpatient Hospital Stay: Admission: RE | Admit: 2021-07-07 | Payer: Self-pay | Source: Ambulatory Visit

## 2021-07-11 ENCOUNTER — Ambulatory Visit
Admission: RE | Admit: 2021-07-11 | Discharge: 2021-07-11 | Disposition: A | Discharge status: Home / Self Care | Payer: No Typology Code available for payment source | Source: Ambulatory Visit | Attending: *Deleted | Admitting: *Deleted

## 2021-07-11 DIAGNOSIS — Z1231 Encounter for screening mammogram for malignant neoplasm of breast: Secondary | ICD-10-CM

## 2021-07-16 ENCOUNTER — Ambulatory Visit (HOSPITAL_COMMUNITY)
Admission: RE | Admit: 2021-07-16 | Discharge: 2021-07-16 | Disposition: A | Discharge status: Home / Self Care | Payer: Self-pay | Source: Ambulatory Visit | Attending: Nurse Practitioner | Admitting: Nurse Practitioner

## 2021-07-16 ENCOUNTER — Other Ambulatory Visit (HOSPITAL_COMMUNITY): Payer: Self-pay | Admitting: Nurse Practitioner

## 2021-07-16 DIAGNOSIS — R9389 Abnormal findings on diagnostic imaging of other specified body structures: Secondary | ICD-10-CM | POA: Insufficient documentation

## 2021-07-17 ENCOUNTER — Other Ambulatory Visit: Payer: Self-pay | Admitting: Obstetrics and Gynecology

## 2021-07-17 DIAGNOSIS — R928 Other abnormal and inconclusive findings on diagnostic imaging of breast: Secondary | ICD-10-CM

## 2021-08-07 ENCOUNTER — Ambulatory Visit: Payer: Self-pay | Admitting: *Deleted

## 2021-08-07 VITALS — BP 100/68 | Wt 198.2 lb

## 2021-08-07 DIAGNOSIS — Z1239 Encounter for other screening for malignant neoplasm of breast: Secondary | ICD-10-CM

## 2021-08-07 NOTE — Progress Notes (Signed)
[name] is a 46 y.o. female who presents to [facility] clinic today with no complaints. Patient referred to [facility] by the [facility] due to having a screening mammogram completed 07/11/2021 that additional imaging of the right breast is recommended for follow up.   Pap Smear: Pap smear not completed today. Last Pap smear was in March 2023 at the [facility] clinic and was normal per patient. Per patient has no history of an abnormal Pap smear. Last Pap smear result is not available in Epic. Previous Pap smear result from 10/19/2016 is in Epic.   Physical exam: Breasts Breasts symmetrical. No skin abnormalities bilateral breasts. No nipple retraction bilateral breasts. No nipple discharge bilateral breasts. No lymphadenopathy. No lumps palpated bilateral breasts. No complaints of pain or tenderness on exam.     MS DIGITAL SCREENING TOMO BILATERAL  Result Date: 07/16/2021 CLINICAL DATA:  Screening. EXAM: DIGITAL SCREENING BILATERAL MAMMOGRAM WITH TOMOSYNTHESIS AND CAD TECHNIQUE: Bilateral screening digital craniocaudal and mediolateral oblique mammograms were obtained. Bilateral screening digital breast tomosynthesis was performed. The images were evaluated with computer-aided detection. COMPARISON:  None. ACR Breast Density Category c: The breast tissue is heterogeneously dense, which may obscure small masses. FINDINGS: In the right breast, a possible mass warrants further evaluation. In the left breast, no findings suspicious for malignancy. IMPRESSION: Further evaluation is suggested for possible mass in the right breast. RECOMMENDATION: Ultrasound of the right breast. (Code:[id number]) The patient will be contacted regarding the findings, and additional imaging will be scheduled. BI-RADS CATEGORY  0: Incomplete. Need additional imaging evaluation and/or prior mammograms for comparison. Electronically Signed   By: Elberta Fortis M.D.   On: 07/16/2021  07:55    Pelvic/Bimanual Pap is not indicated today per BCCCP guidelines.   Smoking History: Patient has never smoked.   Patient Navigation: Patient education provided. Access to services provided for patient through Loop program. Spanish interpreter Rebecca Ruiz from [facility] provided.   Colorectal Cancer Screening: Per patient has never had colonoscopy completed. No complaints today.    Breast and Cervical Cancer Risk Assessment: Patient has family history of a maternal great aunt having breast cancer. Patient has no known genetic mutations or history of radiation treatment to the chest before age 48. Patient does not have history of cervical dysplasia, immunocompromised, or DES exposure in-utero.  Risk Assessment     Risk Scores       08/07/2021   Last edited by: Narda Rutherford, LPN   5-year risk: 0.3 %   Lifetime risk: 4.6 %            A: BCCCP exam without pap smear No complaints.  P: Referred patient to the [facility] for a right breast diagnostic mammogram per recommendation. Appointment scheduled Tuesday, August 26, 2021 at 0950.  Priscille Heidelberg, RN 08/07/2021 12:58 PM

## 2021-08-07 NOTE — Patient Instructions (Signed)
Explained breast self awareness with [name]. Patient did not need a Pap smear today due to last Pap smear was in March 2023 per patient. Let her know BCCCP will cover Pap smears every 3 years unless has a history of abnormal Pap smears. Referred patient to the [facility] for a right breast diagnostic mammogram per recommendation. Appointment scheduled Tuesday, August 26, 2021 at 0950. Patient aware of appointment and will be there. Rebecca Ruiz verbalized understanding.  Wylie Russon, Kathaleen Maser, RN 12:58 PM

## 2021-08-14 ENCOUNTER — Other Ambulatory Visit: Payer: Self-pay

## 2021-08-26 ENCOUNTER — Ambulatory Visit
Admission: RE | Admit: 2021-08-26 | Discharge: 2021-08-26 | Disposition: A | Discharge status: Home / Self Care | Payer: No Typology Code available for payment source | Source: Ambulatory Visit | Attending: Obstetrics and Gynecology | Admitting: Obstetrics and Gynecology

## 2021-08-26 DIAGNOSIS — R928 Other abnormal and inconclusive findings on diagnostic imaging of breast: Secondary | ICD-10-CM

## 2024-02-10 IMAGING — US US PELVIS COMPLETE WITH TRANSVAGINAL
1 series · 13 of 25 positions shown · non-contrast
Comparison: None

CLINICAL DATA: Menorrhagia with regular cycle, LMP 03/12/2021, G7P7



[Series 1: us pelvic complete with transvaginal · 13 of 103 slices shown]
[im 1/103]
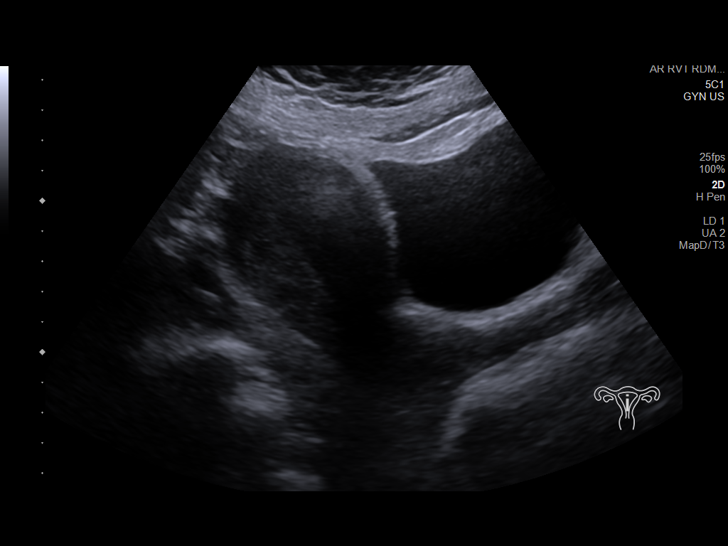
[im 9/103]
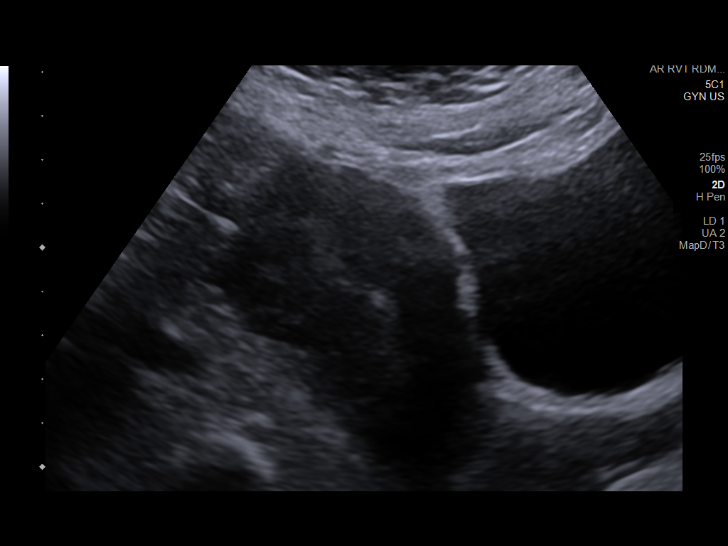
[im 18/103]
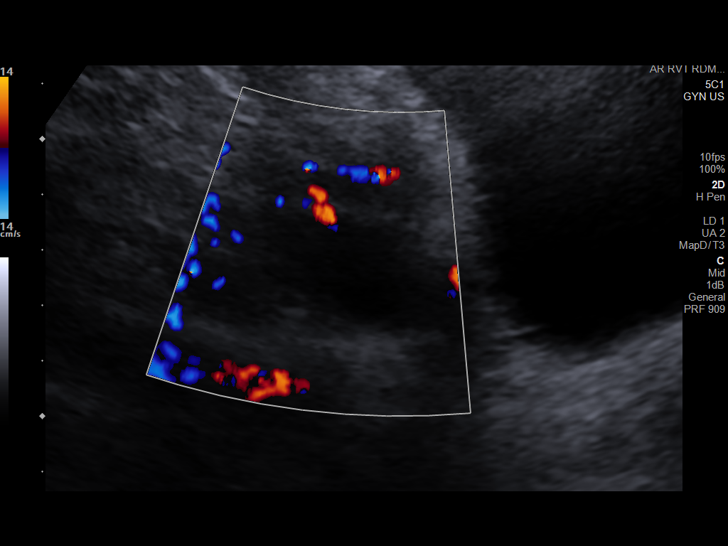
[im 26/103]
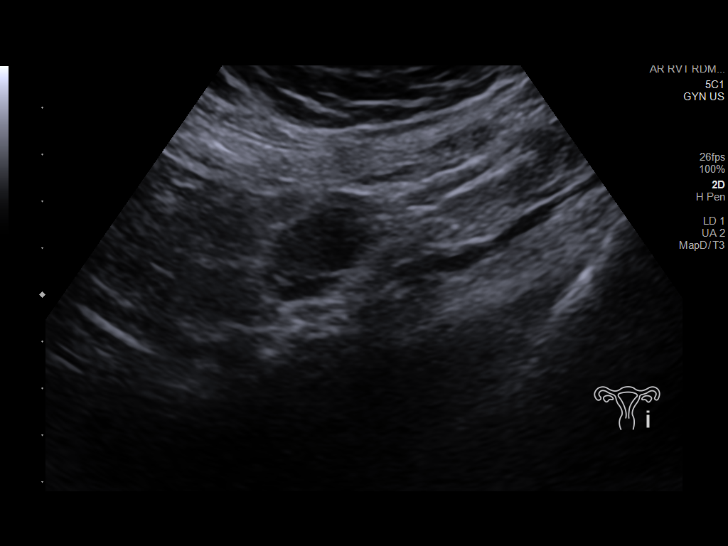
[im 35/103]
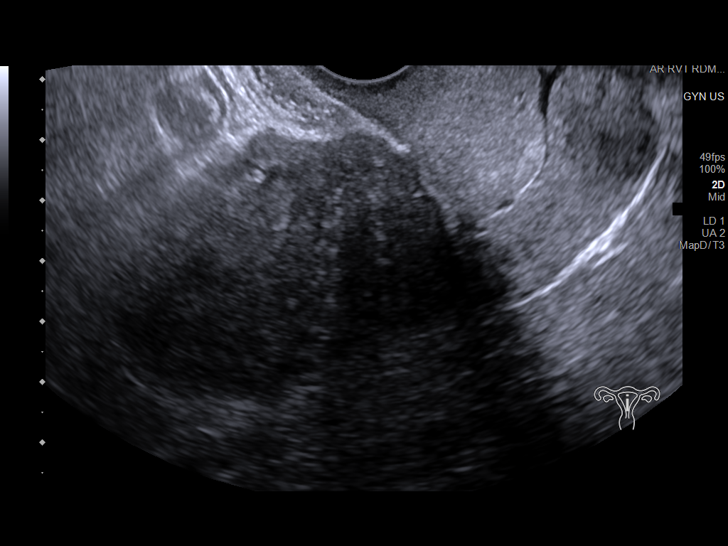
[im 43/103]
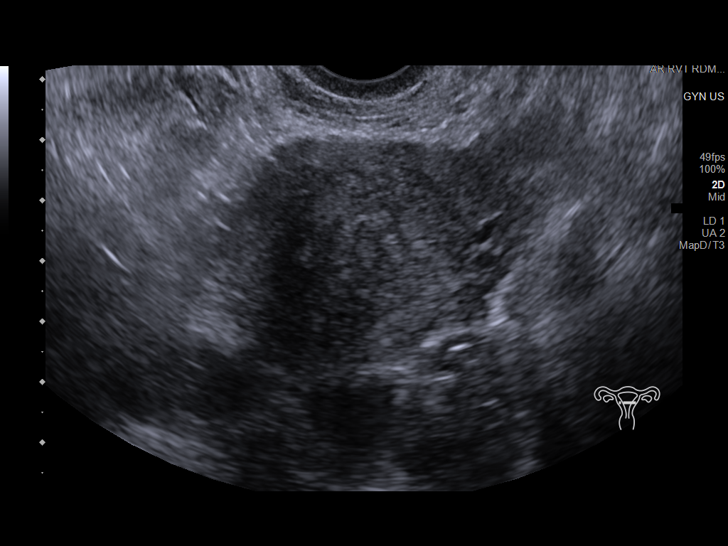
[im 52/103]
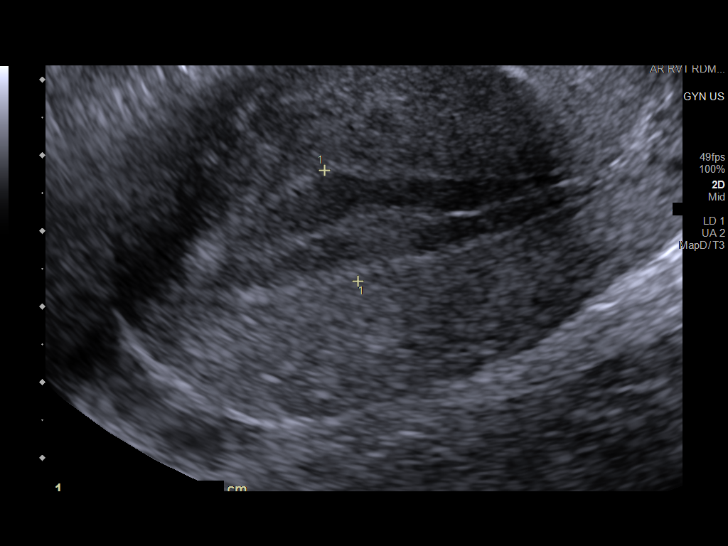
[im 60/103]
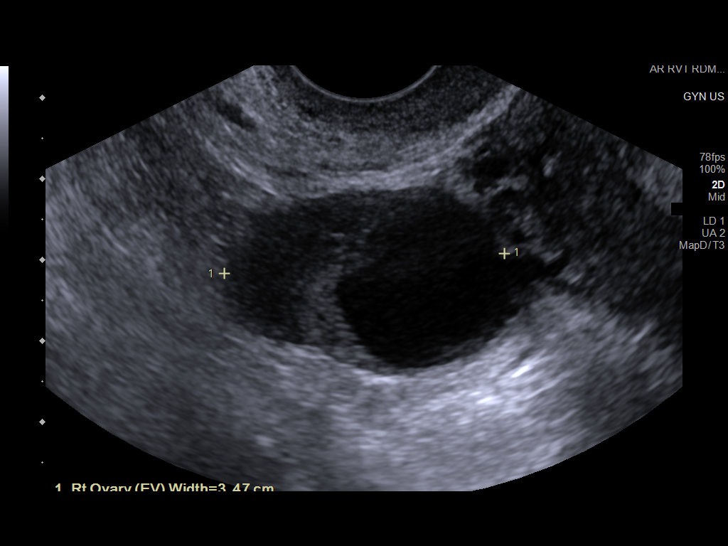
[im 69/103]
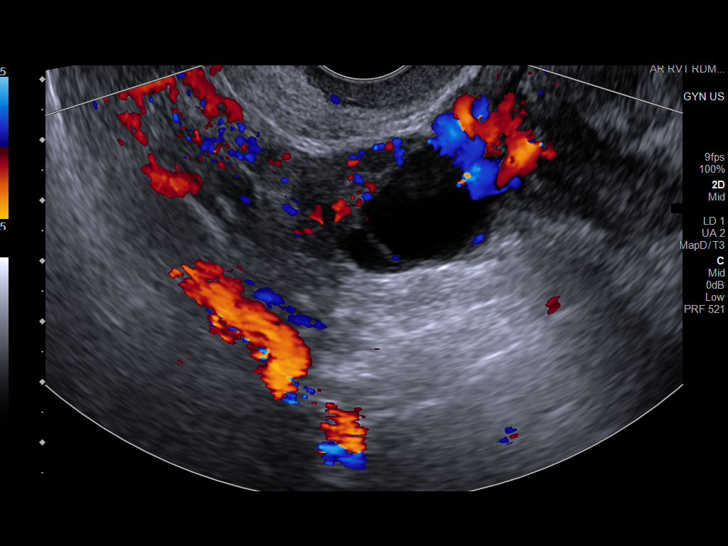
[im 77/103]
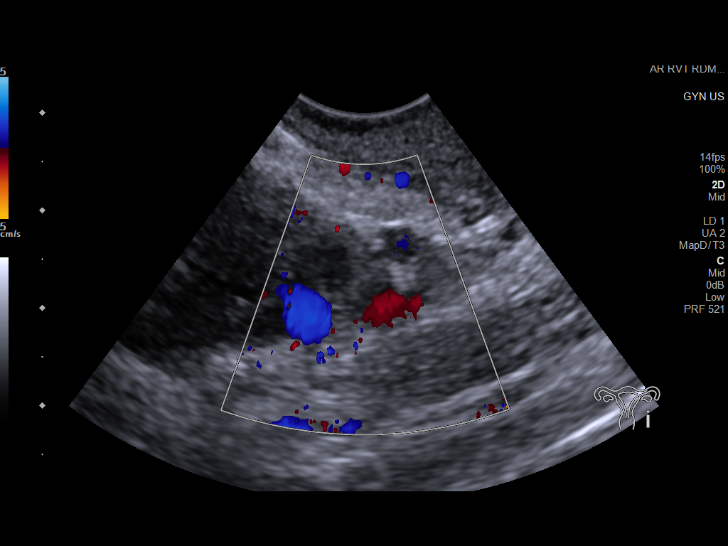
[im 86/103]
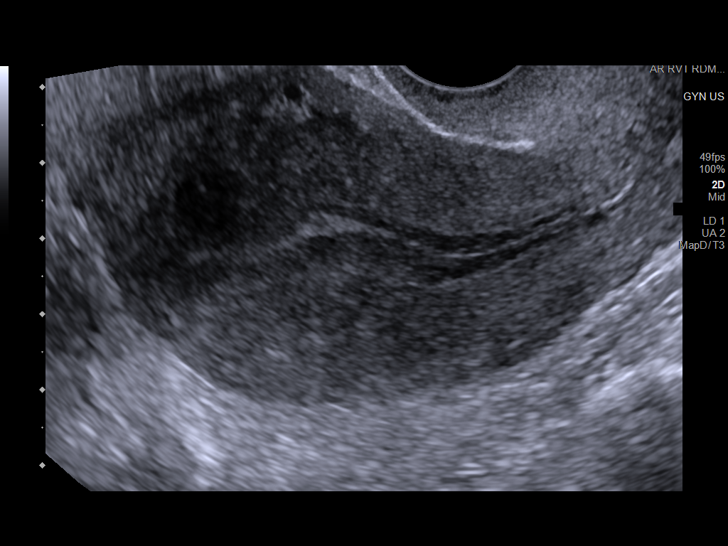
[im 94/103]
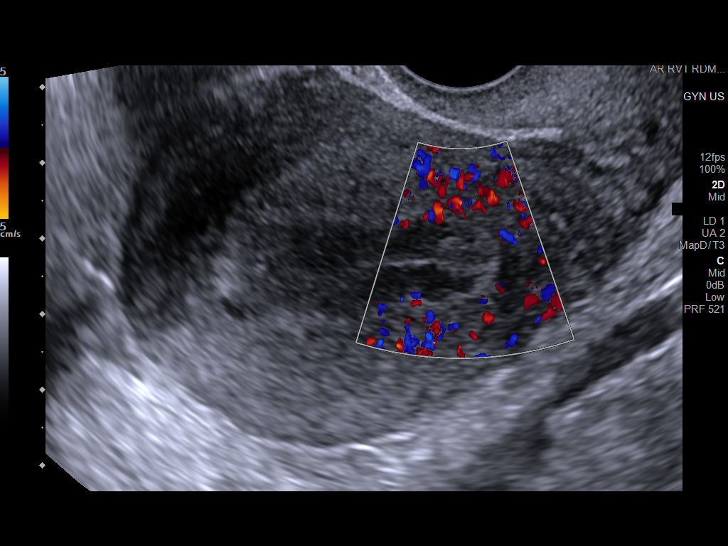
[im 103/103]
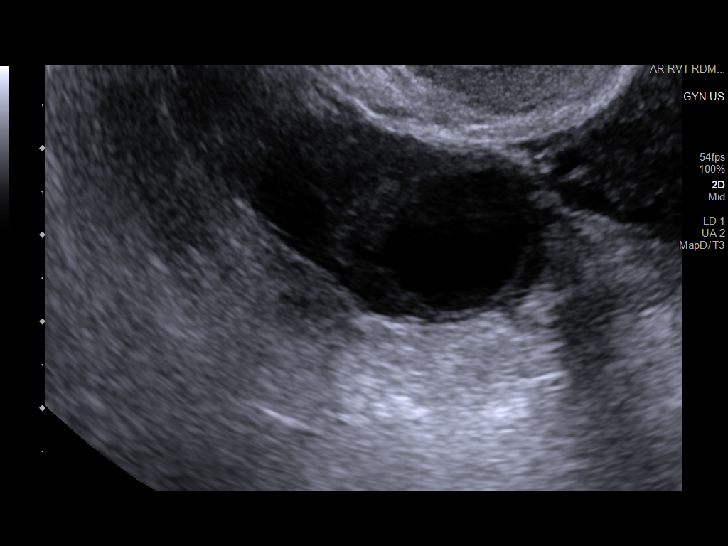

[13 of 25 positions shown; findings below may reference images not displayed]

FINDINGS: Uterus

Measurements: 9.9 x 5.1 x 5.7 cm = volume: 151 mL. Anteverted. Small
intramural leiomyoma anteriorly at upper uterus to RIGHT 1.3 x 1.4 x
1.6 cm.

Endometrium

Thickness: 15 mm. Small echogenic nodule within endometrial complex
at mid uterus 8 x 6 x 6 mm question polyp. No additional masses. No
endometrial fluid.

Right ovary

Measurements: 2.2 x 1.8 x 3.5 cm = volume: 7.2 mL. Dominant follicle
without mass; no follow-up imaging recommended.

Left ovary

Measurements: 2.5 x 1.5 x 2.4 cm = volume: 4.6 mL. Normal morphology
without mass

Other findings

No free pelvic fluid. No adnexal masses. Trace nonspecific fluid
within endocervical canal.
IMPRESSION: 16 mm intramural leiomyoma anterior upper RIGHT uterus.

Question endometrial polyp 8 mm diameter; consider further
evaluation with sonohysterogram for confirmation prior to
hysteroscopy. Endometrial sampling should also be considered if
patient is at high risk for endometrial carcinoma. (Ref:
Radiological Reasoning: Algorithmic Workup of Abnormal Vaginal
Bleeding with Endovaginal Sonography and Sonohysterography. AJR
[id number]; 191:S68-73)
